# Patient Record
Sex: Male | Born: 1980 | Race: White | Hispanic: No | Marital: Married | Smoking: Current every day smoker
Health system: Southern US, Community
[De-identification: ages and names within clinical notes are randomized; demographics above are authoritative.]

---

## 2002-09-20 HISTORY — PX: WISDOM TOOTH EXTRACTION: SHX21

## 2003-09-21 HISTORY — PX: ENUCLEATION: SHX628

## 2012-09-20 HISTORY — PX: KNEE CARTILAGE SURGERY: SHX688

## 2015-05-25 ENCOUNTER — Ambulatory Visit
Admission: EM | Admit: 2015-05-25 | Discharge: 2015-05-25 | Disposition: A | Discharge status: Home / Self Care | Attending: Internal Medicine | Admitting: Internal Medicine

## 2015-05-25 DIAGNOSIS — S6010XA Contusion of unspecified finger with damage to nail, initial encounter: Secondary | ICD-10-CM | POA: Diagnosis not present

## 2015-05-25 NOTE — ED Notes (Signed)
Patient complains of left hand ring finger pain, states that he smashed yesterday. He states that his nail has blood trapped underneath and he thinks needs to be drained secondary to pressure.

## 2015-05-25 NOTE — ED Provider Notes (Signed)
CSN: [id number]     Arrival date & time 05/25/15  1325 History   First MD Initiated Contact with Patient 05/25/15 1434     Chief Complaint  Patient presents with  . 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 It did not bleed, but he has a hematoma under the nail, that has been very painful since. He would like to see if this could be drained today. Good range of motion of the finger, no swelling or bruising other than the subungual hematoma.  No past medical history on file. Past Surgical History  Procedure Laterality Date  . Enucleation Right 2005    Eye  . Wisdom tooth extraction  2004  . Knee cartilage surgery  2014    Social History  Substance Use Topics  . Smoking status: Current Every Day Smoker -- 1.00 packs/day for 15 years    Types: Cigarettes  . Smokeless tobacco: None  . Alcohol Use: 0.0 oz/week    0 Standard drinks or equivalent per week     Comment: once or twice weekly    Review of Systems  All other systems reviewed and are negative.   Allergies  Review of patient's allergies indicates no known allergies.  Home Medications   Prior to Admission medications   Not on File   Meds Ordered and Administered this Visit  Medications - No data to display  BP 128/99 mmHg  Pulse 76  Temp(Src) 97.6 F (36.4 C) (Oral)  Resp 16  Ht 6' (1.829 m)  Wt 160 lb (72.576 kg)  BMI 21.70 kg/m2  SpO2 100% No data found.   Physical Exam  Constitutional: He is oriented to person, place, and time. No distress.  Alert, nicely groomed  HENT:  Head: Atraumatic.  Eyes:  Conjugate gaze, no eye redness/drainage  Neck: Neck supple.  Cardiovascular: Normal rate.   Pulmonary/Chest: No respiratory distress.  Abdominal: Soft. He exhibits no distension.  Musculoskeletal: Normal range of motion.  Left fourth fingernail has hematoma evident, no bleeding or crusting around the  nail. The distal finger itself is not swollen, not bruised, not red. Excellent range of motion at the DIP and PIP. Skin is intact.  Neurological: He is alert and oriented to person, place, and time.  Skin: Skin is warm and dry.  No cyanosis  Nursing note and vitals reviewed.   ED Course  Procedures  Trephination attempt was made with a #11 blade, the patient was unable to tolerate the pressure needed to achieve a hole in the fingernail. Digital block was performed of the left fourth digit with 1% lidocaine, 4.5 cc, after the base of the finger was prepped with Hibiclens. 2 tiny openings were made in the fingernail, with release of a large amount of subungual blood. The nail was subsequently washed with Hibiclens, and some antibiotic ointment was applied.  MDM   1. Subungual hematoma of finger of left hand, initial encounter    Keep the nail clean and dry, recheck for any increasing redness/swelling/pain/drainage from the nail.    Eustace Moore, MD 05/25/15 [phone]

## 2015-05-25 NOTE — Discharge Instructions (Signed)
Subungual Hematoma °A subungual hematoma is a pocket of blood that collects under the fingernail or toenail. The pressure created by the blood under the nail can cause pain. °CAUSES  °A subungual hematoma occurs when an injury to the finger or toe causes a blood vessel beneath the nail to break. The injury can occur from a direct blow such as slamming a finger in a door. It can also occur from a repeated injury such as pressure on the foot in a shoe while running. A subungual hematoma is sometimes called runner's toe or tennis toe. °SYMPTOMS  °· Blue or dark blue skin under the nail. °· Pain or throbbing in the injured area. °DIAGNOSIS  °Your caregiver can determine whether you have a subungual hematoma based on your history and a physical exam. If your caregiver thinks you might have a broken (fractured) bone, X-rays may be taken. °TREATMENT  °Hematomas usually go away on their own over time. Your caregiver may make a hole in the nail to drain the blood. Draining the blood is painless and usually provides significant relief from pain and throbbing. The nail usually grows back normally after this procedure. In some cases, the nail may need to be removed. This is done if there is a cut under the nail that requires stitches (sutures). °HOME CARE INSTRUCTIONS  °· Put ice on the injured area. °¨ Put ice in a plastic bag. °¨ Place a towel between your skin and the bag. °¨ Leave the ice on for 15-20 minutes, 03-04 times a day for the first 1 to 2 days. °· Elevate the injured area to help decrease pain and swelling. °· If you were given a bandage, wear it for as long as directed by your caregiver. °· If part of your nail falls off, trim the remaining nail gently. This prevents the nail from catching on something and causing further injury. °· Only take over-the-counter or prescription medicines for pain, discomfort, or fever as directed by your caregiver. °SEEK IMMEDIATE MEDICAL CARE IF:  °· You have redness or swelling  around the nail. °· You have yellowish-white fluid (pus) coming from the nail. °· Your pain is not controlled with medicine. °· You have a fever. °MAKE SURE YOU: °· Understand these instructions. °· Will watch your condition. °· Will get help right away if you are not doing well or get worse. °Document Released: 09/03/2000 Document Revised: 11/29/2011 Document Reviewed: 08/25/2011 °Exit[employer]® Patient Information ©2015 [employer]. This information is not intended to replace advice given to you by your health care provider. Make sure you discuss any questions you have with your health care provider. ° °

## 2016-07-13 ENCOUNTER — Ambulatory Visit
Admission: EM | Admit: 2016-07-13 | Discharge: 2016-07-13 | Disposition: A | Discharge status: Home / Self Care | Attending: Internal Medicine | Admitting: Internal Medicine

## 2016-07-13 ENCOUNTER — Encounter: Payer: Self-pay | Admitting: Emergency Medicine

## 2016-07-13 DIAGNOSIS — J208 Acute bronchitis due to other specified organisms: Secondary | ICD-10-CM | POA: Diagnosis not present

## 2016-07-13 DIAGNOSIS — H6693 Otitis media, unspecified, bilateral: Secondary | ICD-10-CM

## 2016-07-13 DIAGNOSIS — J209 Acute bronchitis, unspecified: Secondary | ICD-10-CM

## 2016-07-13 MED ORDER — PREDNISONE 50 MG PO TABS: 50.0000 mg | ORAL_TABLET | Freq: Every day | ORAL | 0 refills | Status: DC

## 2016-07-13 MED ORDER — AMOXICILLIN-POT CLAVULANATE 875-125 MG PO TABS: 1.0000 | ORAL_TABLET | Freq: Two times a day (BID) | ORAL | 0 refills | Status: AC

## 2016-07-13 MED ORDER — ALBUTEROL SULFATE HFA 108 (90 BASE) MCG/ACT IN AERS: 2.0000 | INHALATION_SPRAY | RESPIRATORY_TRACT | 0 refills | Status: DC | PRN

## 2016-07-13 NOTE — ED Triage Notes (Signed)
Patient c/o cough, sinus pressure and congestion that started last Friday.

## 2016-07-13 NOTE — ED Provider Notes (Signed)
[facility]    CSN: [id number] Arrival date & time: 07/13/16  [id number]     History   Chief Complaint Chief Complaint  Patient presents with  . Cough  . Facial Pain    HPI Jon AmesDonald Rowland is a 35 y.o. male. He presents today with 3 day history of severe sinus congestion, pounding headache, runny nose. Productive cough. Discomfort and congestion in the ears especially on the right. Scratchy sore throat. Didn't sleep last night. Tactile temp, but no chills. Achy all over. No nausea/vomiting, no diarrhea. 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 Needs a note for work today.  HPI  History reviewed. No pertinent past medical history.  Past Surgical History:  Procedure Laterality Date  . ENUCLEATION Right 2005   Eye  . KNEE CARTILAGE SURGERY  2014  . WISDOM TOOTH EXTRACTION  2004       Home Medications    Prior to Admission medications   Medication Sig Start Date End Date Taking? Authorizing Provider  albuterol (PROVENTIL HFA;VENTOLIN HFA) 108 (90 Base) MCG/ACT inhaler Inhale 2 puffs into the lungs every 4 (four) hours as needed for wheezing or shortness of breath. 07/13/16   Jon MooreLaura W Ichael Pullara, MD  amoxicillin-clavulanate (AUGMENTIN) 875-125 MG tablet Take 1 tablet by mouth 2 (two) times daily. 07/13/16 07/23/16  Jon MooreLaura W Jon Panjwani, MD  predniSONE (DELTASONE) 50 MG tablet Take 1 tablet (50 mg total) by mouth daily. 07/13/16   Jon MooreLaura W Jon Thurmon, MD    Family History History reviewed. No pertinent family history.  Social History Social History  Substance Use Topics  . Smoking status: Current Every Day Smoker    Packs/day: 1.00    Years: 15.00    Types: Cigarettes  . Smokeless tobacco: Never Used  . Alcohol use 0.0 oz/week     Comment: once or twice weekly     Allergies   Review of patient's allergies indicates no known allergies.   Review of Systems Review of Systems  All other systems reviewed and are negative.    Physical  Exam Triage Vital Signs ED Triage Vitals  Enc Vitals Group     BP 07/13/16 1202 (!) 136/98     Pulse Rate 07/13/16 1202 88     Resp 07/13/16 1202 16     Temp 07/13/16 1202 97.5 F (36.4 C)     Temp Source 07/13/16 1202 Oral     SpO2 07/13/16 1202 95 %     Weight 07/13/16 1201 165 lb (74.8 kg)     Height 07/13/16 1201 6' (1.829 m)     Pain Score 07/13/16 1202 0  d Vital Signs BP (!) 136/98 (BP Location: Left Arm)   Pulse 88   Temp 97.5 F (36.4 C) (Oral)   Resp 16   Ht 6' (1.829 m)   Wt 165 lb (74.8 kg)   SpO2 95%   BMI 22.38 kg/m  Physical Exam  Constitutional: He is oriented to person, place, and time. No distress.  Alert, nicely groomed Voice sounds quite congested  HENT:  Head: Atraumatic.  Bilateral TMs are quite dull, red Severe congestion bilaterally, with mucopurulent material present on the left Throat is very red, with modestly enlarged tonsils, no exudate. 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 Left eye without redness/drainage  Neck: Neck supple.  Cardiovascular: Normal rate and regular rhythm.   Pulmonary/Chest: No respiratory distress.  Coarse but symmetric breath  sounds with bibasilar wheezing posteriorly  Abdominal: He exhibits no distension.  Musculoskeletal: Normal range of motion.  Neurological: He is alert and oriented to person, place, and time.  Skin: Skin is warm and dry.  No cyanosis  Nursing note and vitals reviewed.    UC Treatments / Results   Procedures Procedures (including critical care time)      None today  Final Clinical Impressions(s) / UC Diagnoses   Final diagnoses:  Acute bronchitis due to infection  Acute otitis media, bilateral   Prescriptions were sent to the [facility] in [place].  Recheck for increasing phlegm production, new fever >100.5, or if not starting to improve in the next few days.  Anticipate gradual improvement in cough/congestion  over the next 1-2 weeks.    New Prescriptions Discharge Medication List as of 07/13/2016 12:25 PM    START taking these medications   Details  albuterol (PROVENTIL HFA;VENTOLIN HFA) 108 (90 Base) MCG/ACT inhaler Inhale 2 puffs into the lungs every 4 (four) hours as needed for wheezing or shortness of breath., Starting Tue 07/13/2016, Normal    amoxicillin-clavulanate (AUGMENTIN) 875-125 MG tablet Take 1 tablet by mouth 2 (two) times daily., Starting Tue 07/13/2016, Until Fri 07/23/2016, Normal    predniSONE (DELTASONE) 50 MG tablet Take 1 tablet (50 mg total) by mouth daily., Starting Tue 07/13/2016, Normal         Jon Moore, MD 07/14/16 [phone]

## 2016-07-13 NOTE — Discharge Instructions (Addendum)
Prescriptions were sent to the [facility] in [place].  Recheck for increasing phlegm production, new fever >100.5, or if not starting to improve in the next few days.  Anticipate gradual improvement in cough/congestion over the next 1-2 weeks.

## 2016-07-15 ENCOUNTER — Telehealth: Payer: Self-pay | Admitting: Emergency Medicine

## 2018-05-31 ENCOUNTER — Other Ambulatory Visit: Payer: Self-pay

## 2018-05-31 ENCOUNTER — Encounter: Payer: Self-pay | Admitting: Emergency Medicine

## 2018-05-31 ENCOUNTER — Ambulatory Visit
Admission: EM | Admit: 2018-05-31 | Discharge: 2018-05-31 | Disposition: A | Discharge status: Home / Self Care | Attending: Family Medicine | Admitting: Family Medicine

## 2018-05-31 DIAGNOSIS — Z23 Encounter for immunization: Secondary | ICD-10-CM

## 2018-05-31 DIAGNOSIS — S61011A Laceration without foreign body of right thumb without damage to nail, initial encounter: Secondary | ICD-10-CM | POA: Diagnosis not present

## 2018-05-31 DIAGNOSIS — W268XXA Contact with other sharp object(s), not elsewhere classified, initial encounter: Secondary | ICD-10-CM

## 2018-05-31 MED ORDER — TETANUS-DIPHTH-ACELL PERTUSSIS 5-2.5-18.5 LF-MCG/0.5 IM SUSP
0.5000 mL | Freq: Once | INTRAMUSCULAR | Status: AC
  Administered 2018-05-31: 0.5 mL via INTRAMUSCULAR

## 2018-05-31 MED ORDER — LIDOCAINE HCL (PF) 1 % IJ SOLN: 5.0000 mL | Freq: Once | INTRAMUSCULAR | Status: DC

## 2018-05-31 NOTE — Discharge Instructions (Addendum)
Keep clean and dry.  Topical over-the-counter antibiotic ointment daily.  Clean with soap and water.  Use finger splints for at least 2 days.  Return to urgent care in 7 to 10 days for suture removal.  Follow up with your primary care physician this week as needed. Return to Urgent care for new or worsening concerns.

## 2018-05-31 NOTE — ED Triage Notes (Signed)
Patient states that went to through away a coffee mug it shattered and cut his right thumb.

## 2018-05-31 NOTE — ED Provider Notes (Signed)
[facility] ____________________________________________  Time seen: Approximately 9:40 AM  I have reviewed the triage vital signs and the nursing notes.   HISTORY  Chief Complaint Extremity Laceration (right thumb)  HPI Gregeory Diangelo is a 37 y.o. male presenting with wife at bedside for evaluation of right thumb laceration that occurred at approximately 6 this morning.  Patient reports the coffee pot accidentally broke at the end and he accidentally cut his right thumb.  Denies any other injury trauma or crush injury.  Last tetanus immunization 9 years ago.  States mild pain to thumb.  Denies any other pain.  No alleviating measures attempted.  Denies aggravating factors.  Denies paresthesias, decreased range of motion, pain radiation or other complaints.  Reports otherwise feels well. Denies recent sickness. Denies recent antibiotic use.    past medical history Right facial trauma  There are no active problems to display for this patient.   Past Surgical History:  Procedure Laterality Date  . ENUCLEATION Right 2005   Eye  . KNEE CARTILAGE SURGERY  2014  . WISDOM TOOTH EXTRACTION  2004      Current Facility-Administered Medications:  .  lidocaine (PF) (XYLOCAINE) 1 % injection 5 mL, 5 mL, Other, Once, Renford Dills, NP No current outpatient medications on file.  Allergies Patient has no known allergies.  History reviewed. No pertinent family history.  Social History Social History   Tobacco Use  . Smoking status: Current Every Day Smoker    Packs/day: 1.00    Years: 15.00    Pack years: 15.00    Types: Cigarettes  . Smokeless tobacco: Never Used  Substance Use Topics  . Alcohol use: Yes    Alcohol/week: 0.0 standard drinks    Comment: once or twice weekly  . Drug use: No    Review of Systems Constitutional: No fever/chills Cardiovascular: Denies chest pain. Respiratory: Denies shortness of breath. Gastrointestinal: No abdominal pain.     Musculoskeletal: Negative for back pain. Skin:as above.   ____________________________________________   PHYSICAL EXAM:  VITAL SIGNS: ED Triage Vitals  Enc Vitals Group     BP 05/31/18 0838 (!) 138/112     Pulse Rate 05/31/18 0838 100     Resp 05/31/18 0838 16     Temp 05/31/18 0838 97.9 F (36.6 C)     Temp Source 05/31/18 0838 Oral     SpO2 05/31/18 0838 95 %     Weight 05/31/18 0835 165 lb (74.8 kg)     Height 05/31/18 0835 6' (1.829 m)     Head Circumference --      Peak Flow --      Pain Score 05/31/18 0835 7     Pain Loc --      Pain Edu? --      Excl. in GC? --    Vitals:   05/31/18 0835 05/31/18 0838 05/31/18 0957  BP:  (!) 138/112 (!) 120/92  Pulse:  100   Resp:  16   Temp:  97.9 F (36.6 C)   TempSrc:  Oral   SpO2:  95%   Weight: 165 lb (74.8 kg)    Height: 6' (1.829 m)       Constitutional: Alert and oriented. Well appearing and in no acute distress. ENT      Head: Normocephalic and atraumatic. Cardiovascular: Normal rate, regular rhythm. Grossly normal heart sounds.  Good peripheral circulation. Respiratory: Normal respiratory effort without tachypnea nor retractions. Breath sounds are clear and equal bilaterally. No wheezes, rales,  rhonchi. Musculoskeletal: Steady gait.  Neurologic:  Normal speech and language.  Speech is normal. No gait instability.  Skin:  Skin is warm, dry.  Except: Right medial to mid dorsal thumb 3 cm laceration present with active bleeding, no foreign body visualized, no bone or tendon visualized, no tendon injury noted, full range of motion present, good resistance and extension no motor or tendon deficit noted, normal distal sensation, no point bony tenderness, gaping wound. Psychiatric: Mood and affect are normal. Speech and behavior are normal. Patient exhibits appropriate insight and judgment   ___________________________________________   LABS (all labs ordered are listed, but only abnormal results are  displayed)  Labs Reviewed - No data to display   PROCEDURES Procedures   Procedure(s) performed:  Procedure explained and verbal consent obtained. Consent: Verbal consent obtained. Written consent not obtained. Risks and benefits: risks, benefits and alternatives were discussed Patient identity confirmed: verbally with patient and hospital-assigned identification number  Consent given by: patient   Laceration Repair Location: Right thumb Length: 3 cm Foreign bodies: no foreign bodies Tendon involvement: none Nerve involvement: none Preparation: Patient was prepped and draped in the usual sterile fashion. Anesthesia with 1% Lidocaine 5 Mls Cleaned with Betadine Irrigation solution: saline Wound margins revised Irrigation method: jet lavage Amount of cleaning: copious Repaired with 5-0 nylon Number of sutures: 6 Technique: simple interrupted  Approximation: loose Patient tolerate well. Wound well approximated post repair.  Antibiotic ointment and dressing applied.  Wound care instructions provided.  Observe for any signs of infection or other problems.      INITIAL IMPRESSION / ASSESSMENT AND PLAN / ED COURSE  Pertinent labs & imaging results that were available during my care of the patient were reviewed by me and considered in my medical decision making (see chart for details).  Well-appearing patient.  No acute distress.  Right thumb laceration cleaned and repaired as above.  Patient tolerated well.  Tetanus immunization updated.  Directed for supportive care, wound care and suture removal in 7 to 10 days.   Discussed follow up and return parameters including no resolution or any worsening concerns. Patient verbalized understanding and agreed to plan.   ____________________________________________   FINAL CLINICAL IMPRESSION(S) / ED DIAGNOSES  Final diagnoses:  Laceration of right thumb without foreign body without damage to nail, initial encounter     ED  Discharge Orders    None       Note: This dictation was prepared with Dragon dictation along with smaller phrase technology. Any transcriptional errors that result from this process are unintentional.         Renford Dills, NP 05/31/18 1010

## 2018-07-11 ENCOUNTER — Other Ambulatory Visit: Payer: Self-pay

## 2018-07-11 ENCOUNTER — Ambulatory Visit
Admission: EM | Admit: 2018-07-11 | Discharge: 2018-07-11 | Disposition: A | Discharge status: Home / Self Care | Attending: Family Medicine | Admitting: Family Medicine

## 2018-07-11 DIAGNOSIS — M62838 Other muscle spasm: Secondary | ICD-10-CM | POA: Diagnosis not present

## 2018-07-11 MED ORDER — METAXALONE 800 MG PO TABS: 800.0000 mg | ORAL_TABLET | Freq: Three times a day (TID) | ORAL | 0 refills | Status: DC | PRN

## 2018-07-11 MED ORDER — MELOXICAM 15 MG PO TABS: 15.0000 mg | ORAL_TABLET | Freq: Every day | ORAL | 0 refills | Status: DC | PRN

## 2018-07-11 NOTE — Discharge Instructions (Signed)
Rest, heat.  Meds as needed.  Take care  Dr. Adriana Simas

## 2018-07-11 NOTE — ED Triage Notes (Signed)
Patient complains of right side of neck pain that started 2 weeks ago after sleeping wrong.

## 2018-07-11 NOTE — ED Provider Notes (Signed)
[facility]    CSN: [id number] Arrival date & time: 07/11/18  1118  History   Chief Complaint Chief Complaint  Patient presents with  . Neck Pain   HPI  37 year old male presents with neck pain.  2-week history of right-sided neck pain.  Associated right-sided upper lumbar pain as well.  Worse with range of motion.  He states that it started after he slept wrong.  He has tried sleeping in different positions as well as different places in the household without improvement.  No reports of radicular symptoms.  No known relieving factors.  No recent fall, trauma, injury.  His pain is currently moderate in severity.  No other associated symptoms.  No other complaints.  Hx reviewed as below.  Past Surgical History:  Procedure Laterality Date  . ENUCLEATION Right 2005   Eye  . KNEE CARTILAGE SURGERY  2014  . WISDOM TOOTH EXTRACTION  2004   Home Medications    Prior to Admission medications   Medication Sig Start Date End Date Taking? Authorizing Provider  meloxicam (MOBIC) 15 MG tablet Take 1 tablet (15 mg total) by mouth daily as needed for pain. 07/11/18   Tommie Sams, DO  metaxalone (SKELAXIN) 800 MG tablet Take 1 tablet (800 mg total) by mouth 3 (three) times daily as needed for muscle spasms. 07/11/18   Tommie Sams, DO   Social History Social History   Tobacco Use  . Smoking status: Current Every Day Smoker    Packs/day: 1.00    Years: 15.00    Pack years: 15.00    Types: Cigarettes  . Smokeless tobacco: Never Used  Substance Use Topics  . Alcohol use: Yes    Alcohol/week: 0.0 standard drinks    Comment: once or twice weekly  . Drug use: No     Allergies   Patient has no known allergies.   Review of Systems Review of Systems  Constitutional: Negative.   Musculoskeletal: Positive for back pain and neck pain.  Neurological: Negative.    Physical Exam Triage Vital Signs ED Triage Vitals  Enc Vitals Group     BP 07/11/18 1130 (!) 142/106       Pulse Rate 07/11/18 1130 (!) 106     Resp 07/11/18 1130 18     Temp 07/11/18 1130 98.2 F (36.8 C)     Temp Source 07/11/18 1130 Oral     SpO2 07/11/18 1130 98 %     Weight 07/11/18 1128 165 lb (74.8 kg)     Height 07/11/18 1128 6' (1.829 m)     Head Circumference --      Peak Flow --      Pain Score 07/11/18 1128 7     Pain Loc --      Pain Edu? --      Excl. in GC? --    Updated Vital Signs BP (!) 142/106 (BP Location: Left Arm)   Pulse (!) 106   Temp 98.2 F (36.8 C) (Oral)   Resp 18   Ht 6' (1.829 m)   Wt 74.8 kg   SpO2 98%   BMI 22.38 kg/m   Visual Acuity Right Eye Distance:   Left Eye Distance:   Bilateral Distance:    Right Eye Near:   Left Eye Near:    Bilateral Near:     Physical Exam  Constitutional: He is oriented to person, place, and time. He appears well-developed. No distress.  HENT:  Head: Normocephalic and atraumatic.  Cardiovascular: Regular rhythm.  Tachycardia.  Pulmonary/Chest: Effort normal and breath sounds normal. He has no wheezes. He has no rales.  Musculoskeletal:  Patient with right trapezius exquisite tenderness to palpation.  Neurological: He is alert and oriented to person, place, and time.  Psychiatric: He has a normal mood and affect. His behavior is normal.  Nursing note and vitals reviewed.  UC Treatments / Results  Labs (all labs ordered are listed, but only abnormal results are displayed) Labs Reviewed - No data to display  EKG None  Radiology No results found.  Procedures Procedures (including critical care time)  Medications Ordered in UC Medications - No data to display  Initial Impression / Assessment and Plan / UC Course  I have reviewed the triage vital signs and the nursing notes.  Pertinent labs & imaging results that were available during my care of the patient were reviewed by me and considered in my medical decision making (see chart for details).     37 year old male presents with trapezius  muscle spasm.  Treating with meloxicam and Skelaxin.  Work note given.  Final Clinical Impressions(s) / UC Diagnoses   Final diagnoses:  Trapezius muscle spasm     Discharge Instructions     Rest, heat.  Meds as needed.  Take care  Dr. Adriana Simas    ED Prescriptions    Medication Sig Dispense Auth. Provider   meloxicam (MOBIC) 15 MG tablet Take 1 tablet (15 mg total) by mouth daily as needed for pain. 30 tablet Yesena Reaves G, DO   metaxalone (SKELAXIN) 800 MG tablet Take 1 tablet (800 mg total) by mouth 3 (three) times daily as needed for muscle spasms. 30 tablet Tommie Sams, DO     Controlled Substance Prescriptions  Controlled Substance Registry consulted? Not Applicable   Tommie Sams, DO 07/11/18 1211

## 2018-11-27 ENCOUNTER — Encounter: Payer: Self-pay | Admitting: Emergency Medicine

## 2018-11-27 ENCOUNTER — Ambulatory Visit
Admission: EM | Admit: 2018-11-27 | Discharge: 2018-11-27 | Disposition: A | Discharge status: Home / Self Care | Attending: Family Medicine | Admitting: Family Medicine

## 2018-11-27 ENCOUNTER — Other Ambulatory Visit: Payer: Self-pay

## 2018-11-27 DIAGNOSIS — M25571 Pain in right ankle and joints of right foot: Secondary | ICD-10-CM

## 2018-11-27 MED ORDER — MELOXICAM 15 MG PO TABS: 15.0000 mg | ORAL_TABLET | Freq: Every day | ORAL | 0 refills | Status: DC | PRN

## 2018-11-27 NOTE — ED Triage Notes (Signed)
Patient stated he is having right ankle pain that started on Saturday. He states he had hockey practice on Saturday and doesn't recall any injury.

## 2018-11-27 NOTE — ED Provider Notes (Signed)
[facility] ____________________________________________  Time seen: Approximately 10:30 AM  I have reviewed the triage vital signs and the nursing notes.   HISTORY  Chief Complaint Ankle Pain   HPI Jon Rowland is a 38 y.o. male presenting for evaluation of right ankle pain since Saturday night.  States Saturday during the day he did play hockey, and states he may have been injured but does not remember any specific injury.  States he felt fine immediately after hockey, but reports once he got home and rested his ankle he then felt the pain when he got up.  States pain is worse with direct palpation as well as walking.  Has continued to ambulate.  Denies other alleviating measures.  States able to still move ankle overall well, but states he has somewhat guarded movement.  Multiple sprains in the past, but denies any worse injuries.  Reports otherwise feels well and denies other complaints.    History reviewed. No pertinent past medical history.  There are no active problems to display for this patient.   Past Surgical History:  Procedure Laterality Date  . ENUCLEATION Right 2005   Eye  . KNEE CARTILAGE SURGERY  2014  . WISDOM TOOTH EXTRACTION  2004     No current facility-administered medications for this encounter.   Current Outpatient Medications:  .  meloxicam (MOBIC) 15 MG tablet, Take 1 tablet (15 mg total) by mouth daily as needed., Disp: 10 tablet, Rfl: 0  Allergies Patient has no known allergies.  History reviewed. No pertinent family history.  Social History Social History   Tobacco Use  . Smoking status: Current Every Day Smoker    Packs/day: 1.00    Years: 15.00    Pack years: 15.00    Types: Cigarettes  . Smokeless tobacco: Never Used  Substance Use Topics  . Alcohol use: Yes    Alcohol/week: 0.0 standard drinks    Comment: once or twice weekly  . Drug use: No    Review of Systems Constitutional: No fever Cardiovascular: Denies  chest pain. Respiratory: Denies shortness of breath. Gastrointestinal: No abdominal pain. Musculoskeletal: Positive right ankle pain. Skin: Negative for rash.   ____________________________________________   PHYSICAL EXAM:  VITAL SIGNS: ED Triage Vitals  Enc Vitals Group     BP 11/27/18 0909 (!) 136/105     Pulse Rate 11/27/18 0909 78     Resp 11/27/18 0909 18     Temp 11/27/18 0909 97.9 F (36.6 C)     Temp Source 11/27/18 0909 Oral     SpO2 11/27/18 0909 100 %     Weight 11/27/18 0907 165 lb (74.8 kg)     Height 11/27/18 0907 6' (1.829 m)     Head Circumference --      Peak Flow --      Pain Score 11/27/18 0907 7     Pain Loc --      Pain Edu? --      Excl. in GC? --     Constitutional: Alert and oriented. Well appearing and in no acute distress. ENT      Head: Normocephalic and atraumatic. Cardiovascular: Normal rate, regular rhythm. Grossly normal heart sounds.  Good peripheral circulation. Respiratory: Normal respiratory effort without tachypnea nor retractions. Breath sounds are clear and equal bilaterally. No wheezes, rales, rhonchi. Musculoskeletal:Bilateral pedal pulses 2+ equal and easily palpated.  Except: Right lateral ankle mild to moderate tenderness to palpation along ATFL ligament, minimal tenderness to lower lateral malleolus, and tenderness at  lateral collateral ligament, no posterior tenderness, no tenderness Achilles tendon, negative Thompson squeeze test, pain with ankle rotation as well as full plantarflexion and dorsiflexion, right lower extremity otherwise nontender.  Ambulatory with mild antalgic gait. Neurologic:  Normal speech and language.Speech is normal. No gait instability.  Skin:  Skin is warm, dry and intact. No rash noted. Psychiatric: Mood and affect are normal. Speech and behavior are normal. Patient exhibits appropriate insight and judgment   ___________________________________________   LABS (all labs ordered are listed, but only  abnormal results are displayed)  Labs Reviewed - No data to display  PROCEDURES Procedures    INITIAL IMPRESSION / ASSESSMENT AND PLAN / ED COURSE  Pertinent labs & imaging results that were available during my care of the patient were reviewed by me and considered in my medical decision making (see chart for details).  Well-appearing patient.  No acute distress.  Right ankle pain for the last 2 days.  Did play hockey, but denies any known direct injury.  suspect sprain injury.  Minimal point bony tenderness.  Discussed supportive care versus x-ray at this time, will defer x-ray, patient agrees.  Ankle splint given.  Patient denies crutches.  Mobic Rx given.  Ice, rest, supportive care and gradual increase of activity as tolerated.  Discussed follow-up for imaging for continued pain.Discussed indication, risks and benefits of medications with patient.  Discussed follow up with Primary care physician this week. Discussed follow up and return parameters including no resolution or any worsening concerns. Patient verbalized understanding and agreed to plan.   ____________________________________________   FINAL CLINICAL IMPRESSION(S) / ED DIAGNOSES  Final diagnoses:  Acute right ankle pain     ED Discharge Orders         Ordered    meloxicam (MOBIC) 15 MG tablet  Daily PRN     11/27/18 0936           Note: This dictation was prepared with Dragon dictation along with smaller phrase technology. Any transcriptional errors that result from this process are unintentional.         Renford Dills, NP 11/27/18 1036

## 2018-11-27 NOTE — Discharge Instructions (Addendum)
Take medication as prescribed. Rest. Ice. Elevate. Monitor. Gradual increase activity as tolerated.   Follow up with your primary care physician this week as needed. Return to Urgent care for new or worsening concerns.

## 2019-06-01 ENCOUNTER — Encounter: Payer: Self-pay | Admitting: Emergency Medicine

## 2019-06-01 ENCOUNTER — Ambulatory Visit (INDEPENDENT_AMBULATORY_CARE_PROVIDER_SITE_OTHER)

## 2019-06-01 ENCOUNTER — Other Ambulatory Visit: Payer: Self-pay

## 2019-06-01 ENCOUNTER — Ambulatory Visit
Admission: EM | Admit: 2019-06-01 | Discharge: 2019-06-01 | Disposition: A | Discharge status: Home / Self Care | Attending: Emergency Medicine | Admitting: Emergency Medicine

## 2019-06-01 DIAGNOSIS — X500XXA Overexertion from strenuous movement or load, initial encounter: Secondary | ICD-10-CM | POA: Diagnosis not present

## 2019-06-01 DIAGNOSIS — T148XXA Other injury of unspecified body region, initial encounter: Secondary | ICD-10-CM

## 2019-06-01 DIAGNOSIS — M25512 Pain in left shoulder: Secondary | ICD-10-CM | POA: Diagnosis not present

## 2019-06-01 DIAGNOSIS — S40019A Contusion of unspecified shoulder, initial encounter: Secondary | ICD-10-CM | POA: Diagnosis not present

## 2019-06-01 MED ORDER — IBUPROFEN 600 MG PO TABS: 600.0000 mg | ORAL_TABLET | Freq: Four times a day (QID) | ORAL | 0 refills | Status: DC | PRN

## 2019-06-01 MED ORDER — HYDROCODONE-ACETAMINOPHEN 5-325 MG PO TABS: 1.0000 | ORAL_TABLET | Freq: Four times a day (QID) | ORAL | 0 refills | Status: DC | PRN

## 2019-06-01 MED ORDER — TIZANIDINE HCL 4 MG PO TABS: 4.0000 mg | ORAL_TABLET | Freq: Three times a day (TID) | ORAL | 0 refills | Status: DC | PRN

## 2019-06-01 NOTE — ED Provider Notes (Signed)
HPI  SUBJECTIVE:  Jon Rowland is a 38 y.o. male who presents with constant sharp pain, swelling at the sternoclavicular joint/medial clavicle starting yesterday after lifting heavy cast iron pipes and digging a deep hole.  States that he has shoulder tightness, but no shoulder pain.  States that he was hit by a hockey puck in this area 4 days ago, which left a "mark".  He was not wearing protective equipment in this area.  States that the pain is worse today than immediately after the original injury.  No shoulder weakness, distal numbness or tingling.  No chest pain, shortness of breath, cough, hemoptysis.  He tried Tylenol, ice without improvement in his symptoms.  Symptoms are worse with palpation, wearing a seatbelt, rolling over in bed, shoulder range of motion.  Past medical history negative for osteoporosis, clavicle fracture, coagulopathy.  PMD: [facility].  History reviewed. No pertinent past medical history.  Past Surgical History:  Procedure Laterality Date  . ENUCLEATION Right 2005   Eye  . KNEE CARTILAGE SURGERY  2014  . WISDOM TOOTH EXTRACTION  2004    Family History  Problem Relation Age of Onset  . Healthy Mother   . Other Father        unknown medical history    Social History   Tobacco Use  . Smoking status: Current Every Day Smoker    Packs/day: 1.00    Years: 15.00    Pack years: 15.00    Types: Cigarettes  . Smokeless tobacco: Never Used  Substance Use Topics  . Alcohol use: Yes    Alcohol/week: 0.0 standard drinks    Comment: once or twice weekly  . Drug use: No    No current facility-administered medications for this encounter.   Current Outpatient Medications:  .  HYDROcodone-acetaminophen (NORCO/VICODIN) 5-325 MG tablet, Take 1-2 tablets by mouth every 6 (six) hours as needed for moderate pain or severe pain., Disp: 12 tablet, Rfl: 0 .  ibuprofen (ADVIL) 600 MG tablet, Take 1 tablet (600 mg total) by mouth every 6 (six) hours as needed.,  Disp: 30 tablet, Rfl: 0 .  tiZANidine (ZANAFLEX) 4 MG tablet, Take 1 tablet (4 mg total) by mouth every 8 (eight) hours as needed for muscle spasms., Disp: 30 tablet, Rfl: 0  No Known Allergies   ROS  As noted in HPI.   Physical Exam  BP (!) 129/104 (BP Location: Right Arm)   Pulse 88   Temp 99.1 F (37.3 C) (Oral)   Resp 18   Ht 6' (1.829 m)   Wt 77.1 kg   SpO2 99%   BMI 23.06 kg/m   Constitutional: Well developed, well nourished, no acute distress Eyes:  EOMI, conjunctiva normal bilaterally HENT: Normocephalic, atraumatic,mucus membranes moist Respiratory: Normal inspiratory effort, good air movement throughout all lung fields left side. Cardiovascular: Normal rate regular rhythm no murmurs rubs or gallops.  Positive erythematous abrasion on anterior chest at the junction of the medial clavicle and sternum.  Positive tender swelling medial clavicle.  No crepitus, tenderness over the sternum.      GI: nondistended skin: No rash, skin intact Musculoskeletal: R shoulder with ROM  somewhat limited due to pain at the medial clavicle.  He denies shoulder pain during range of motion, Drop test  painful but negative, middle and distal clavicle NT, A/C joint NT, scapula NT , proximal humerus NT , shoulder joint NT, Motor strength normal, Sensation intact LT over deltoid region, distal NVI with hand on affected  side having intact sensation and strength in the distribution of the median, radial, and ulnar nerve.  Pain at clavicle aggravated with internal rotation, no pain with external rotation, pain at clavicle aggravated with empty can test, liftoff test.   Neurologic: Alert & oriented x 3, no focal neuro deficits Psychiatric: Speech and behavior appropriate   ED Course   Medications - No data to display  Orders Placed This Encounter  Procedures  . DG Clavicle Left    Standing Status:   Standing    Number of Occurrences:   1    Order Specific Question:   Reason for Exam  (SYMPTOM  OR DIAGNOSIS REQUIRED)    Answer:   trauma swelling tenderenss medial clavicle r/o fx dislocation    No results found for this or any previous visit (from the past 55 hour(s)). Dg Clavicle Left  Result Date: 06/01/2019 CLINICAL DATA:  Shoulder pain and erythema following injury several days ago. EXAM: LEFT CLAVICLE - 2+ VIEWS COMPARISON:  None. FINDINGS: The mineralization and alignment are normal. There is no evidence of acute fracture or dislocation. The acromioclavicular and glenohumeral joints appear unremarkable. The subacromial space is preserved. The sternoclavicular joint is not well visualized. No evidence of foreign body or focal soft tissue swelling. IMPRESSION: Normal left clavicle radiographs.  No evidence of acute injury. Electronically Signed   By: Richardean Sale M.D.   On: 06/01/2019 11:26    ED Clinical Impression  1. Contusion of clavicular region      ED Assessment/Plan  Will x-ray clavicle to rule out clavicle fracture, clavicle dislocation. If negative, suspect contusion at the sternoclavicular joint.  Home with ibuprofen 600 mg combined with a Tylenol containing product 3 or 4 times a day.  1 g of Tylenol for mild to moderate pain, 1-2 Norco for severe pain.  Also Zanaflex because patient states that the tightness extending over his pectoral area feels like a muscle spasm.  [place] [facility] database reviewed for this patient, and feel that the risk/benefit ratio today is favorable for proceeding with a prescription for controlled substance.  No opiate prescriptions in 2 years.  Reviewed radiology independently.  Normal.  See radiology report for full details.  Plan as above.  If does not get better in a week, follow-up with [facility] for further evaluation to make sure that he does not have a sternoclavicular dislocation.  Discussed imaging, MDM, treatment plan, and plan for follow-up with patient. Discussed sn/sx that should prompt return to the ED. patient  agrees with plan.   Meds ordered this encounter  Medications  . HYDROcodone-acetaminophen (NORCO/VICODIN) 5-325 MG tablet    Sig: Take 1-2 tablets by mouth every 6 (six) hours as needed for moderate pain or severe pain.    Dispense:  12 tablet    Refill:  0  . ibuprofen (ADVIL) 600 MG tablet    Sig: Take 1 tablet (600 mg total) by mouth every 6 (six) hours as needed.    Dispense:  30 tablet    Refill:  0  . tiZANidine (ZANAFLEX) 4 MG tablet    Sig: Take 1 tablet (4 mg total) by mouth every 8 (eight) hours as needed for muscle spasms.    Dispense:  30 tablet    Refill:  0    *This clinic note was created using [employer]. Therefore, there may be occasional mistakes despite careful proofreading.   ?    Melynda Ripple, MD 06/01/19 1153

## 2019-06-01 NOTE — Discharge Instructions (Addendum)
Your x-ray was negative for clavicle fracture or dislocation.  Ice the affected area for 20 minutes at a time.  Take ibuprofen combined with a Tylenol containing product 3 or 4 times a day as needed for pain.  Ibuprofen combined with 1 g of Tylenol for mild to moderate pain, ibuprofen with 1-2 Norco for severe pain.  Do not take the Tylenol and Norco together as we discussed.  Rest.  Follow-up with [facility] if you are not getting any better with conservative treatment in 1 week for reevaluation and possible more advanced imaging.

## 2019-06-01 NOTE — ED Triage Notes (Signed)
Patient in today c/o left shoulder pain x 6 days. Patient states he was playing hockey on 05/26/19 and was hit in the left shoulder/chest area with a hockey puck.

## 2019-06-18 ENCOUNTER — Emergency Department
Admission: EM | Admit: 2019-06-18 | Discharge: 2019-06-19 | Disposition: A | Discharge status: Other Acute Inpatient Hospital | Attending: Emergency Medicine | Admitting: Emergency Medicine

## 2019-06-18 ENCOUNTER — Emergency Department

## 2019-06-18 ENCOUNTER — Other Ambulatory Visit: Payer: Self-pay

## 2019-06-18 DIAGNOSIS — Z79899 Other long term (current) drug therapy: Secondary | ICD-10-CM | POA: Insufficient documentation

## 2019-06-18 DIAGNOSIS — T50902A Poisoning by unspecified drugs, medicaments and biological substances, intentional self-harm, initial encounter: Secondary | ICD-10-CM | POA: Insufficient documentation

## 2019-06-18 DIAGNOSIS — F332 Major depressive disorder, recurrent severe without psychotic features: Secondary | ICD-10-CM | POA: Diagnosis not present

## 2019-06-18 DIAGNOSIS — F329 Major depressive disorder, single episode, unspecified: Secondary | ICD-10-CM | POA: Insufficient documentation

## 2019-06-18 DIAGNOSIS — Z20828 Contact with and (suspected) exposure to other viral communicable diseases: Secondary | ICD-10-CM | POA: Diagnosis not present

## 2019-06-18 DIAGNOSIS — F17210 Nicotine dependence, cigarettes, uncomplicated: Secondary | ICD-10-CM | POA: Diagnosis not present

## 2019-06-18 MED ORDER — SODIUM CHLORIDE 0.9 % IV BOLUS
1000.0000 mL | Freq: Once | INTRAVENOUS | Status: AC
  Administered 2019-06-19: 1000 mL via INTRAVENOUS

## 2019-06-18 NOTE — ED Triage Notes (Addendum)
Pt arrives to ED via [facility] from home with c/o overdose after a SA by taking "a handful of medications". EMS reports pt possibly ingested "8 or 9 Advil tablets, 8 or 9 Benadryl tablets, an unknown number of Ritalin tablets, an unknown number of 'seizure' tablets, and an unknown amount of ETOH". EMS states pt was not given Narcan,but was unconscious upon their arrival. EMS reports pt became responsive while en route to the ED. Pt is currently A&Ox4 in NAD; RR even, regular,and unlabored. Of note: EMS reports pt has metal shrapnel in his skull and cannot have an MRI.

## 2019-06-18 NOTE — ED Notes (Signed)
Patient transported to X-ray at this time 

## 2019-06-18 NOTE — ED Provider Notes (Signed)
[facility] Emergency Department Provider Note   ____________________________________________   First MD Initiated Contact with Patient 06/18/19 2326     (approximate)  I have reviewed the triage vital signs and the nursing notes.   HISTORY  Chief Complaint Drug Overdose    HPI Jon Rowland is a 38 y.o. male brought to the ED via EMS status post suicide attempt by intentional overdose.  Patient reports possibly ingesting 8-9 Advil tablets, 8-9 Benadryl tablets, unknown number of Ritalin tablets, unknown number of seizure tablets and large amount of alcohol.  EMS reports patient was unconscious upon their arrival but subsequently became responsive in route to the ED.  Patient did not receive Narcan en route.       Past medical history None  Patient Active Problem List   Diagnosis Date Noted  . MDD (major depressive disorder), recurrent episode, severe (HCC) 06/19/2019    Past Surgical History:  Procedure Laterality Date  . ENUCLEATION Right 2005   Eye  . KNEE CARTILAGE SURGERY  2014  . WISDOM TOOTH EXTRACTION  2004    Prior to Admission medications   Medication Sig Start Date End Date Taking? Authorizing Provider  HYDROcodone-acetaminophen (NORCO/VICODIN) 5-325 MG tablet Take 1-2 tablets by mouth every 6 (six) hours as needed for moderate pain or severe pain. 06/01/19  Yes Domenick Gong, MD  ibuprofen (ADVIL) 600 MG tablet Take 1 tablet (600 mg total) by mouth every 6 (six) hours as needed. 06/01/19  Yes Domenick Gong, MD  tiZANidine (ZANAFLEX) 4 MG tablet Take 1 tablet (4 mg total) by mouth every 8 (eight) hours as needed for muscle spasms. 06/01/19  Yes Domenick Gong, MD    Allergies Patient has no known allergies.  Family History  Problem Relation Age of Onset  . Healthy Mother   . Other Father        unknown medical history    Social History Social History   Tobacco Use  . Smoking status: Current Every Day Smoker   Packs/day: 1.00    Years: 15.00    Pack years: 15.00    Types: Cigarettes  . Smokeless tobacco: Never Used  Substance Use Topics  . Alcohol use: Yes    Alcohol/week: 0.0 standard drinks    Comment: once or twice weekly  . Drug use: No    Review of Systems  Constitutional: No fever/chills Eyes: No visual changes. ENT: No sore throat. Cardiovascular: Denies chest pain. Respiratory: Denies shortness of breath. Gastrointestinal: No abdominal pain.  No nausea, no vomiting.  No diarrhea.  No constipation. Genitourinary: Negative for dysuria. Musculoskeletal: Negative for back pain. Skin: Negative for rash. Neurological: Negative for headaches, focal weakness or numbness. Psychiatric:  Positive for depression with SI.   ____________________________________________   PHYSICAL EXAM:  VITAL SIGNS: ED Triage Vitals [06/18/19 2325]  Enc Vitals Group     BP      Pulse      Resp      Temp      Temp src      SpO2 98 %     Weight      Height      Head Circumference      Peak Flow      Pain Score      Pain Loc      Pain Edu?      Excl. in GC?     Constitutional: Alert and oriented. Well appearing and in mild acute distress. Eyes: Conjunctivae are normal. PERRL.  EOMI. Head: Atraumatic. Nose: No congestion/rhinnorhea. Mouth/Throat: Mucous membranes are moist.  Oropharynx non-erythematous. Neck: No stridor.   Cardiovascular: Normal rate, regular rhythm. Grossly normal heart sounds.  Good peripheral circulation. Respiratory: Normal respiratory effort.  No retractions. Lungs CTAB. Gastrointestinal: Soft and nontender. No distention. No abdominal bruits. No CVA tenderness. Musculoskeletal: No lower extremity tenderness nor edema.  No joint effusions. Neurologic:  Normal speech and language. No gross focal neurologic deficits are appreciated. No gait instability. Skin:  Skin is warm, dry and intact. No rash noted. Psychiatric: Mood and affect are flat, tearful. Speech and  behavior are normal.  ____________________________________________   LABS (all labs ordered are listed, but only abnormal results are displayed)  Labs Reviewed  COMPREHENSIVE METABOLIC PANEL - Abnormal; Notable for the following components:      Result Value   Glucose, Bld 107 (*)    Alkaline Phosphatase 36 (*)    All other components within normal limits  ACETAMINOPHEN LEVEL - Abnormal; Notable for the following components:   Acetaminophen (Tylenol), Serum <10 (*)    All other components within normal limits  ETHANOL - Abnormal; Notable for the following components:   Alcohol, Ethyl (B) 151 (*)    All other components within normal limits  ACETAMINOPHEN LEVEL - Abnormal; Notable for the following components:   Acetaminophen (Tylenol), Serum <10 (*)    All other components within normal limits  SARS CORONAVIRUS 2 (HOSPITAL ORDER, [place] [facility])  SALICYLATE LEVEL  URINE DRUG SCREEN, QUALITATIVE ([place])  CBC WITH DIFFERENTIAL/PLATELET  SALICYLATE LEVEL   ____________________________________________  EKG  ED ECG REPORT I, SUNG,JADE J, the attending physician, personally viewed and interpreted this ECG.   Date: 06/18/2019  EKG Time: 0021  Rate: 72  Rhythm: normal EKG, normal sinus rhythm  Axis: Normal  Intervals:none  ST&T Change: Nonspecific  ____________________________________________  RADIOLOGY  ED MD interpretation: No acute cardiopulmonary process; shrapnel  Official radiology report(s): Dg Chest 1 View  Result Date: 06/18/2019 CLINICAL DATA:  Overdose EXAM: CHEST  1 VIEW COMPARISON:  None. FINDINGS: The heart size and mediastinal contours are within normal limits. Both lungs are clear. The visualized skeletal structures are unremarkable. Triangular opacity over the right lower chest. Amorphous opacity over the right mid upper arm. IMPRESSION: No active disease. Triangular opacity over the right lower chest and small amorphous opacity  over the right mid upper arm are either artifacts or small foreign bodies. Electronically Signed   By: Donavan Foil M.D.   On: 06/18/2019 23:52    ____________________________________________   PROCEDURES  Procedure(s) performed (including Critical Care):  Procedures  CRITICAL CARE Performed by: Paulette Blanch   Total critical care time: 30 minutes  Critical care time was exclusive of separately billable procedures and treating other patients.  Critical care was necessary to treat or prevent imminent or life-threatening deterioration.  Critical care was time spent personally by me on the following activities: development of treatment plan with patient and/or surrogate as well as nursing, discussions with consultants, evaluation of patient's response to treatment, examination of patient, obtaining history from patient or surrogate, ordering and performing treatments and interventions, ordering and review of laboratory studies, ordering and review of radiographic studies, pulse oximetry and re-evaluation of patient's condition. ____________________________________________   INITIAL IMPRESSION / ASSESSMENT AND PLAN / ED COURSE  As part of my medical decision making, I reviewed the following data within the [place] notes reviewed and incorporated, Labs reviewed, EKG interpreted, Old chart  reviewed, Radiograph reviewed, A consult was requested and obtained from this/these consultant(s) Psychiatry and Notes from prior ED visits     Francina AmesDonald Beiser was evaluated in Emergency Department on 06/19/2019 for the symptoms described in the history of present illness. He was evaluated in the context of the global COVID-19 pandemic, which necessitated consideration that the patient might be at risk for infection with the SARS-CoV-2 virus that causes COVID-19. Institutional protocols and algorithms that pertain to the evaluation of patients at risk for COVID-19 are in a state of  rapid change based on information released by regulatory bodies including the CDC and federal and state organizations. These policies and algorithms were followed during the patient's care in the ED.    38 year old male who presents under IVC status post intentional drug overdose.  Will maintain IVC pending medical work-up, psychiatric evaluation and disposition.   Clinical Course as of Jun 19 619  Tue Jun 19, 2019  0035 Patient was evaluated by psychiatric NP; to be admitted after he is medically cleared.   [JS]  0612 Repeat acetaminophen and salicylate levels unremarkable.  Patient is medically cleared for psychiatric disposition.   [JS]    Clinical Course User Index [JS] Irean HongSung, Jade J, MD     ____________________________________________   FINAL CLINICAL IMPRESSION(S) / ED DIAGNOSES  Final diagnoses:  Intentional drug overdose, initial encounter [facility](HCC)     ED Discharge Orders    None       Note:  This document was prepared using Dragon voice recognition software and may include unintentional dictation errors.   Irean HongSung, Jade J, MD 06/19/19 [id number]

## 2019-06-19 ENCOUNTER — Inpatient Hospital Stay
Admission: AD | Admit: 2019-06-19 | Discharge: 2019-06-20 | DRG: 882 | Disposition: A | Discharge status: Home / Self Care | Attending: Psychiatry | Admitting: Psychiatry

## 2019-06-19 ENCOUNTER — Other Ambulatory Visit: Payer: Self-pay

## 2019-06-19 ENCOUNTER — Encounter: Payer: Self-pay | Admitting: Behavioral Health

## 2019-06-19 DIAGNOSIS — F332 Major depressive disorder, recurrent severe without psychotic features: Secondary | ICD-10-CM | POA: Diagnosis present

## 2019-06-19 DIAGNOSIS — F322 Major depressive disorder, single episode, severe without psychotic features: Secondary | ICD-10-CM | POA: Diagnosis present

## 2019-06-19 DIAGNOSIS — Z7289 Other problems related to lifestyle: Secondary | ICD-10-CM

## 2019-06-19 DIAGNOSIS — Z87820 Personal history of traumatic brain injury: Secondary | ICD-10-CM | POA: Diagnosis not present

## 2019-06-19 DIAGNOSIS — F17210 Nicotine dependence, cigarettes, uncomplicated: Secondary | ICD-10-CM | POA: Diagnosis present

## 2019-06-19 DIAGNOSIS — R45851 Suicidal ideations: Secondary | ICD-10-CM | POA: Diagnosis present

## 2019-06-19 DIAGNOSIS — F4310 Post-traumatic stress disorder, unspecified: Principal | ICD-10-CM

## 2019-06-19 DIAGNOSIS — Z20828 Contact with and (suspected) exposure to other viral communicable diseases: Secondary | ICD-10-CM | POA: Diagnosis present

## 2019-06-19 DIAGNOSIS — F1010 Alcohol abuse, uncomplicated: Secondary | ICD-10-CM

## 2019-06-19 DIAGNOSIS — T50902A Poisoning by unspecified drugs, medicaments and biological substances, intentional self-harm, initial encounter: Secondary | ICD-10-CM | POA: Diagnosis not present

## 2019-06-19 LAB — CBC WITH DIFFERENTIAL/PLATELET
Abs Immature Granulocytes: 0.05 10*3/uL (ref 0.00–0.07)
Basophils Absolute: 0.1 10*3/uL (ref 0.0–0.1)
Basophils Relative: 1 %
Eosinophils Absolute: 0.3 10*3/uL (ref 0.0–0.5)
Eosinophils Relative: 4 %
HCT: 45 % (ref 39.0–52.0)
Hemoglobin: 15.5 g/dL (ref 13.0–17.0)
Immature Granulocytes: 1 %
Lymphocytes Relative: 36 %
Lymphs Abs: 2.6 10*3/uL (ref 0.7–4.0)
MCH: 32.8 pg (ref 26.0–34.0)
MCHC: 34.4 g/dL (ref 30.0–36.0)
MCV: 95.3 fL (ref 80.0–100.0)
Monocytes Absolute: 0.6 10*3/uL (ref 0.1–1.0)
Monocytes Relative: 8 %
Neutro Abs: 3.7 10*3/uL (ref 1.7–7.7)
Neutrophils Relative %: 50 %
Platelets: 175 10*3/uL (ref 150–400)
RBC: 4.72 MIL/uL (ref 4.22–5.81)
RDW: 12.8 % (ref 11.5–15.5)
WBC: 7.2 10*3/uL (ref 4.0–10.5)
nRBC: 0 % (ref 0.0–0.2)

## 2019-06-19 LAB — SALICYLATE LEVEL
Salicylate Lvl: <7 mg/dL (ref 2.8–30.0)
Salicylate Lvl: <7 mg/dL (ref 2.8–30.0)

## 2019-06-19 LAB — COMPREHENSIVE METABOLIC PANEL
ALT: 32 U/L (ref 0–44)
AST: 21 U/L (ref 15–41)
Albumin: 4.2 g/dL (ref 3.5–5.0)
Alkaline Phosphatase: 36 U/L — ABNORMAL LOW (ref 38–126)
Anion gap: 9 (ref 5–15)
BUN: 11 mg/dL (ref 6–20)
CO2: 24 mmol/L (ref 22–32)
Calcium: 9.1 mg/dL (ref 8.9–10.3)
Chloride: 107 mmol/L (ref 98–111)
Creatinine, Ser: 0.95 mg/dL (ref 0.61–1.24)
GFR calc Af Amer: >60 mL/min (ref >60)
GFR calc non Af Amer: >60 mL/min (ref >60)
Glucose, Bld: 107 mg/dL — ABNORMAL HIGH (ref 70–99)
Potassium: 3.6 mmol/L (ref 3.5–5.1)
Sodium: 140 mmol/L (ref 135–145)
Total Bilirubin: 0.9 mg/dL (ref 0.3–1.2)
Total Protein: 6.6 g/dL (ref 6.5–8.1)

## 2019-06-19 LAB — URINE DRUG SCREEN, QUALITATIVE (ARMC ONLY)
Amphetamines, Ur Screen: NOT DETECTED
Barbiturates, Ur Screen: NOT DETECTED
Benzodiazepine, Ur Scrn: NOT DETECTED
Cannabinoid 50 Ng, Ur ~~LOC~~: NOT DETECTED
Cocaine Metabolite,Ur ~~LOC~~: NOT DETECTED
MDMA (Ecstasy)Ur Screen: NOT DETECTED
Methadone Scn, Ur: NOT DETECTED
Opiate, Ur Screen: NOT DETECTED
Phencyclidine (PCP) Ur S: NOT DETECTED
Tricyclic, Ur Screen: NOT DETECTED

## 2019-06-19 LAB — ACETAMINOPHEN LEVEL
Acetaminophen (Tylenol), Serum: <10 ug/mL — ABNORMAL LOW (ref 10–30)
Acetaminophen (Tylenol), Serum: <10 ug/mL — ABNORMAL LOW (ref 10–30)

## 2019-06-19 LAB — ETHANOL: Alcohol, Ethyl (B): 151 mg/dL — ABNORMAL HIGH (ref <10)

## 2019-06-19 LAB — SARS CORONAVIRUS 2 BY RT PCR (HOSPITAL ORDER, PERFORMED IN ~~LOC~~ HOSPITAL LAB): SARS Coronavirus 2: NEGATIVE

## 2019-06-19 MED ORDER — TRAZODONE HCL 100 MG PO TABS: 100.0000 mg | ORAL_TABLET | Freq: Every evening | ORAL | Status: DC | PRN

## 2019-06-19 MED ORDER — NICOTINE 21 MG/24HR TD PT24
21.0000 mg | MEDICATED_PATCH | Freq: Every day | TRANSDERMAL | Status: DC
  Administered 2019-06-19 – 2019-06-20 (×2): 21 mg via TRANSDERMAL
  Filled 2019-06-19 (×2): qty 1

## 2019-06-19 MED ORDER — FLUOXETINE HCL 20 MG PO CAPS
20.0000 mg | ORAL_CAPSULE | Freq: Every day | ORAL | Status: DC
  Administered 2019-06-19 – 2019-06-20 (×2): 20 mg via ORAL
  Filled 2019-06-19 (×2): qty 1

## 2019-06-19 MED ORDER — ALUM & MAG HYDROXIDE-SIMETH 200-200-20 MG/5ML PO SUSP: 30.0000 mL | ORAL | Status: DC | PRN

## 2019-06-19 MED ORDER — ACETAMINOPHEN 325 MG PO TABS: 650.0000 mg | ORAL_TABLET | Freq: Four times a day (QID) | ORAL | Status: DC | PRN

## 2019-06-19 MED ORDER — MAGNESIUM HYDROXIDE 400 MG/5ML PO SUSP: 30.0000 mL | Freq: Every day | ORAL | Status: DC | PRN

## 2019-06-19 NOTE — ED Notes (Addendum)
Pt sleeping. Chest rise and fall visible. Bed locked low. Rail up. May not need to move to [facility] as per psych/counselor bed has become available in inpt psych. Will notify charge RN.

## 2019-06-19 NOTE — Tx Team (Signed)
Initial Treatment Plan 06/19/2019 4:08 PM Arabella Merles [id number]    PATIENT STRESSORS: Financial difficulties Marital or family conflict   PATIENT STRENGTHS: Ability for insight Communication skills General fund of knowledge Supportive family/friends Work skills   PATIENT IDENTIFIED PROBLEMS: Depression/Anxiety  Family issues  Lapse in judgment                 DISCHARGE CRITERIA:  Ability to meet basic life and health needs Improved stabilization in mood, thinking, and/or behavior Need for constant or close observation no longer present Reduction of life-threatening or endangering symptoms to within safe limits  PRELIMINARY DISCHARGE PLAN: Outpatient therapy Return to previous living arrangement Return to previous work or school arrangements  PATIENT/FAMILY INVOLVEMENT: This treatment plan has been presented to and reviewed with the patient, Muhsin Doris. The patient has been given the opportunity to ask questions and make suggestions.  Catina Nuss, RN 06/19/2019, 4:08 PM

## 2019-06-19 NOTE — ED Notes (Signed)
Pt transferred into ED BHU room 4   Patient assigned to appropriate care area. Patient oriented to unit/care area: Informed that, for his safety, care areas are designed for safety and monitored by security cameras at all times; Visiting hours and phone times explained to patient. Patient verbalizes understanding, and verbal contract for safety obtained.   Assessment completed  He denies pain   
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
## 2019-06-19 NOTE — Plan of Care (Signed)
New admission.  Problem: Education: Goal: Knowledge of [place] [facility] Education information/materials will improve Outcome: Not Progressing Goal: Emotional status will improve Outcome: Not Progressing Goal: Mental status will improve Outcome: Not Progressing Goal: Verbalization of understanding the information provided will improve Outcome: Not Progressing   Problem: Health Behavior/Discharge Planning: Goal: Compliance with treatment plan for underlying cause of condition will improve Outcome: Not Progressing   Problem: Safety: Goal: Periods of time without injury will increase Outcome: Not Progressing   Problem: Coping: Goal: Coping ability will improve Outcome: Not Progressing Goal: Will verbalize feelings Outcome: Not Progressing   Problem: Self-Concept: Goal: Level of anxiety will decrease Outcome: Not Progressing   Problem: Self-Concept: Goal: Ability to identify factors that promote anxiety will improve Outcome: Not Progressing Goal: Level of anxiety will decrease Outcome: Not Progressing Goal: Ability to modify response to factors that promote anxiety will improve Outcome: Not Progressing

## 2019-06-19 NOTE — ED Notes (Signed)
Per Dr Beather Arbour, pt is medically cleared at this time and ready for psychiatric treatment/care.

## 2019-06-19 NOTE — ED Notes (Signed)
Patient given breakfast tray.

## 2019-06-19 NOTE — BH Assessment (Signed)
Patient is to be admitted to [facility] by Dr. Claris Gower.  Attending Physician will be Dr. Weber Cooks.   Patient has been assigned to room 303, by [place].   Intake Paper Work has been signed and placed on patient chart.   ER staff is aware of the admission:  N[place], ER Secretary    Dr. Kerman Passey, ER MD   Amy T., Patient's Nurse   Butch Penny, Patient Access.

## 2019-06-19 NOTE — ED Notes (Signed)
Wife has 5 more minutes at bedside visit then pt being moved to [facility].

## 2019-06-19 NOTE — ED Notes (Signed)
Pt requesting to speak with MD. Pt advised that I will let his nurse know. RN Amy informed.

## 2019-06-19 NOTE — Plan of Care (Signed)
Patient newly admitted, hasn't had time to progress.   Problem: Education: Goal: Knowledge of [place] [facility] Education information/materials will improve Outcome: Not Progressing Goal: Emotional status will improve Outcome: Not Progressing Goal: Mental status will improve Outcome: Not Progressing Goal: Verbalization of understanding the information provided will improve Outcome: Not Progressing   Problem: Health Behavior/Discharge Planning: Goal: Compliance with treatment plan for underlying cause of condition will improve Outcome: Not Progressing   Problem: Safety: Goal: Periods of time without injury will increase Outcome: Not Progressing   Problem: Coping: Goal: Coping ability will improve Outcome: Not Progressing Goal: Will verbalize feelings Outcome: Not Progressing   

## 2019-06-19 NOTE — ED Notes (Signed)
BEHAVIORAL HEALTH ROUNDING Patient sleeping: No. Patient alert and oriented: yes Behavior appropriate: Yes.  ; If no, describe:  Nutrition and fluids offered: yes Toileting and hygiene offered: Yes  Sitter present: q15 minute observations and security camera monitoring   

## 2019-06-19 NOTE — ED Notes (Signed)
Report given to Amy, RN [id number].

## 2019-06-19 NOTE — ED Notes (Signed)

## 2019-06-19 NOTE — ED Notes (Addendum)
Pt belongings: 1 white shirt, 1 pr black tennis shoes, 1 pr black socks, 1 pr grey striped boxers, 1 pr tan shorts, 1 green belt, 1 red lighter, 1 orange silicone wristband. Pt allowed to keep wedding band and woven bracelet that cannot be removed without cutting/destroying it. Pt's belongings bagged and labeled per policy.

## 2019-06-19 NOTE — Progress Notes (Signed)
Admission Note:  D: Pt appeared depressed  With  a flat affect.  Pt  denies SI / AVH at this time.  Patient  Admitted under the service of Dr. Weber Cooks . Patient overdose on a multitude of medication.  Patient denies suicidal ideations at present time. Patient  Stated  Stressor of wife having MS brain tumor and having seizures  his job that he may have lost . Patient drinks  2-3  Beers daily . Pt is redirectable and cooperative with assessment.      A: Pt admitted to unit per protocol, skin assessment and search done and no contraband found. In [employer]  He  Lost  His eye .  Pt   educated on therapeutic milieu rules. Pt was introduced to milieu by nursing staff.   Medical History:  Major Depression  Allergies : NKA   R: Pt was receptive to education about the milieu .  15 min safety checks started. Probation officer offered support

## 2019-06-19 NOTE — BH Assessment (Signed)
Assessment Note  Jon Rowland is an 38 y.o. male who presents to the ER due to having an intentional overdose on his medications. He reports of having the thoughts and feelings of helplessness, hopelessness and worthlessness. Patient states, he believes his family will be better off without him. His wife and children can get the insurance money and he's wife can find another man who is able to take care of her. Patient has had thoughts for an extended period of time. However, this past weekend the patient may have lost his job. His wife was sick and EMS had to be called. Patient had to call out from work, to take care of his wife. Patient states, his employer refused to call him back or respond to his text messages. Thus, the thoughts of him being worthless increased and it resulted in him attempting to end his life.  During the interview, the patient was calm, cooperative and pleasant. He was able to provide appropriate answers to the questions. He admits to the use of alcohol and cannabis. He last drink, was prior to coming to the ER. During the time of this interview, his BAC hadn't resulted.  Patient denies HI and AV/H.   Diagnosis: Major Depression  Past Medical History: History reviewed. No pertinent past medical history.  Past Surgical History:  Procedure Laterality Date  . ENUCLEATION Right 2005   Eye  . KNEE CARTILAGE SURGERY  2014  . WISDOM TOOTH EXTRACTION  2004    Family History:  Family History  Problem Relation Age of Onset  . Healthy Mother   . Other Father        unknown medical history    Social History:  reports that he has been smoking cigarettes. He has a 15.00 pack-year smoking history. He has never used smokeless tobacco. He reports current alcohol use. He reports that he does not use drugs.  Additional Social History:  Alcohol / Drug Use Pain Medications: See PTA Prescriptions: See PTA Over the Counter: See PTA History of alcohol / drug use?: Yes Longest  period of sobriety (when/how long): Unable to quantify Substance #1 Name of Substance 1: Alcohol Substance #2 Name of Substance 2: Cannabis  CIWA: CIWA-Ar BP: 129/81 Pulse Rate: 71 COWS:    Allergies: No Known Allergies  Home Medications: (Not in a hospital admission)   OB/GYN Status:  No LMP for male patient.  General Assessment Data Location of Assessment: [facility] ED TTS Assessment: In system Is this a Tele or Face-to-Face Assessment?: Face-to-Face Is this an Initial Assessment or a Re-assessment for this encounter?: Initial Assessment Language Other than English: No Living Arrangements: Other (Comment)(Private Home) What gender do you identify as?: Male Marital status: Married Pregnancy Status: No Living Arrangements: Spouse/significant other, Children Can pt return to current living arrangement?: Yes Admission Status: Involuntary Petitioner: ED Attending Is patient capable of signing voluntary admission?: No(Under IVC) Referral Source: Self/Family/Friend Insurance type: T[employer]are  Medical Screening Exam [facility] Walk-in ONLY) Medical Exam completed: Yes  Crisis Care Plan Living Arrangements: Spouse/significant other, Children Name of Psychiatrist: Reports of none Name of Therapist: Reports of none  Education Status Is patient currently in school?: No Is the patient employed, unemployed or receiving disability?: Employed  Risk to self with the past 6 months Suicidal Ideation: Yes-Currently Present Has patient been a risk to self within the past 6 months prior to admission? : Yes Suicidal Intent: Yes-Currently Present Has patient had any suicidal intent within the past 6 months prior to admission? :  Yes Is patient at risk for suicide?: Yes Suicidal Plan?: Yes-Currently Present Has patient had any suicidal plan within the past 6 months prior to admission? : Yes Specify Current Suicidal Plan: Overdose on medication Access to Means: Yes Specify Access to Suicidal  Means: Overdose on medications What has been your use of drugs/alcohol within the last 12 months?: Alcohol & Cannabis Previous Attempts/Gestures: No How many times?: 1 Other Self Harm Risks: Reports of none Triggers for Past Attempts: Family contact, Other personal contacts, Other (Comment) Intentional Self Injurious Behavior: None Family Suicide History: Unknown Recent stressful life event(s): Job Loss, Conflict (Comment), Financial Problems, Other (Comment) Persecutory voices/beliefs?: No Depression: Yes Depression Symptoms: Tearfulness, Isolating, Loss of interest in usual pleasures, Feeling worthless/self pity Substance abuse history and/or treatment for substance abuse?: Yes Suicide prevention information given to non-admitted patients: Not applicable  Risk to Others within the past 6 months Homicidal Ideation: No Does patient have any lifetime risk of violence toward others beyond the six months prior to admission? : No Thoughts of Harm to Others: No Current Homicidal Intent: No Current Homicidal Plan: No Access to Homicidal Means: No Identified Victim: Reports of none History of harm to others?: No Assessment of Violence: None Noted Violent Behavior Description: Reports of none Does patient have access to weapons?: No Criminal Charges Pending?: No Does patient have a court date: No Is patient on probation?: No  Psychosis Hallucinations: None noted Delusions: None noted  Mental Status Report Appearance/Hygiene: Unremarkable, In scrubs Eye Contact: Fair Motor Activity: Freedom of movement, Unremarkable Speech: Logical/coherent, Unremarkable Level of Consciousness: Alert Mood: Depressed, Sad, Pleasant Affect: Appropriate to circumstance, Depressed, Sad Anxiety Level: Minimal Thought Processes: Coherent, Relevant Judgement: Partial Orientation: Person, Place, Time, Situation, Appropriate for developmental age Obsessive Compulsive Thoughts/Behaviors:  Moderate  Cognitive Functioning Concentration: Decreased Memory: Recent Intact, Remote Intact Is patient IDD: No Insight: Fair Impulse Control: Fair Appetite: Fair Have you had any weight changes? : No Change Sleep: Decreased Total Hours of Sleep: 5 Vegetative Symptoms: None  ADLScreening [facility] Assessment Services) Patient's cognitive ability adequate to safely complete daily activities?: Yes Patient able to express need for assistance with ADLs?: Yes Independently performs ADLs?: Yes (appropriate for developmental age)  Prior Inpatient Therapy Prior Inpatient Therapy: No  Prior Outpatient Therapy Prior Outpatient Therapy: No Does patient have an ACCT team?: No Does patient have Intensive In-House Services?  : No Does patient have [facility] services? : No Does patient have P4CC services?: No  ADL Screening (condition at time of admission) Patient's cognitive ability adequate to safely complete daily activities?: Yes Is the patient deaf or have difficulty hearing?: No Does the patient have difficulty seeing, even when wearing glasses/contacts?: No Does the patient have difficulty concentrating, remembering, or making decisions?: No Patient able to express need for assistance with ADLs?: Yes Does the patient have difficulty dressing or bathing?: No Independently performs ADLs?: Yes (appropriate for developmental age) Does the patient have difficulty walking or climbing stairs?: No Weakness of Legs: None Weakness of Arms/Hands: None  Home Assistive Devices/Equipment Home Assistive Devices/Equipment: None  Therapy Consults (therapy consults require a physician order) PT Evaluation Needed: No OT Evalulation Needed: No SLP Evaluation Needed: No Abuse/Neglect Assessment (Assessment to be complete while patient is alone) Abuse/Neglect Assessment Can Be Completed: Yes Physical Abuse: Denies Verbal Abuse: Denies Sexual Abuse: Denies Exploitation of patient/patient's resources:  Denies Self-Neglect: Denies Values / Beliefs Cultural Requests During Hospitalization: None Spiritual Requests During Hospitalization: None Consults Spiritual Care Consult Needed: No Social Work  Consult Needed: No Advance Directives (For Healthcare) Does Patient Have a Medical Advance Directive?: No       Child/Adolescent Assessment Running Away Risk: Denies(Patient is an adult )  Disposition:  Disposition Initial Assessment Completed for this Encounter: Yes  On Site Evaluation by:   Reviewed with Physician:    Gunnar Fusi MS, LCAS, [facility], [place] Therapeutic Triage Specialist 06/19/2019 1:41 AM
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
## 2019-06-19 NOTE — ED Notes (Signed)
ED  Is the patient under IVC or is there intent for IVC: Yes.   Is the patient medically cleared: Yes.   Is there vacancy in the ED BHU: Yes.   Is the population mix appropriate for patient: Yes.   Is the patient awaiting placement in inpatient or outpatient setting: Yes.  Awaiting inpt bed to be assigned  Has the patient had a psychiatric consult: Yes.   Survey of unit performed for contraband, proper placement and condition of furniture, tampering with fixtures in bathroom, shower, and each patient room: Yes.  ; Findings:  APPEARANCE/BEHAVIOR Calm and cooperative NEURO ASSESSMENT Orientation: oriented x3  Denies pain Hallucinations: No.None noted (Hallucinations)  denies Speech: Normal Gait: normal RESPIRATORY ASSESSMENT Even  Unlabored respirations  CARDIOVASCULAR ASSESSMENT Pulses equal   regular rate  Skin warm and dry   GASTROINTESTINAL ASSESSMENT no GI complaint EXTREMITIES Full ROM  PLAN OF CARE Provide calm/safe environment. Vital signs assessed twice daily. ED BHU Assessment once each 12-hour shift. Assure the ED provider has rounded once each shift. Provide and encourage hygiene. Provide redirection as needed. Assess for escalating behavior; address immediately and inform ED provider.  Assess family dynamic and appropriateness for visitation as needed: Yes.  ; If necessary, describe findings:  Educate the patient/family about BHU procedures/visitation: Yes.  ; If necessary, describe findings:

## 2019-06-19 NOTE — BH Assessment (Signed)
Writer called and spoke with Jon Rowland ext. [phone]), currently have no beds, advised to call back after 9am.

## 2019-06-19 NOTE — ED Notes (Signed)
Pt resting calmly in bed.  
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
## 2019-06-19 NOTE — ED Notes (Signed)
Pt's wife updated on pt's condition and plan of care at this time.

## 2019-06-19 NOTE — Progress Notes (Signed)
D - Patient was on the phone upon arrival to the unit. Patient was pleasant during assessment. Patient denies SI/HI/AVH, pain, anxiety and depression with this Probation officer. Patient was isolative to his room this evening but was observed interacting appropriate with staff and peers when was out. Patient was on the phone several times this evening, acting appropriately.   A - Patient didn't have any medications scheduled this evening. Patient given education. Patient given support and encouragement to be active in his treatment plan. Patient informed to staff know if there are any issues or problems on the unit.   R - Patient being monitored Q 15 minutes for safety per unit protocol. Patient remains safe on the unit.

## 2019-06-20 MED ORDER — TRAZODONE HCL 100 MG PO TABS: 100.0000 mg | ORAL_TABLET | Freq: Every evening | ORAL | 1 refills | Status: DC | PRN

## 2019-06-20 MED ORDER — FLUOXETINE HCL 20 MG PO CAPS: 20.0000 mg | ORAL_CAPSULE | Freq: Every day | ORAL | 1 refills | Status: AC

## 2019-06-20 NOTE — Plan of Care (Signed)
  Problem: Education: Goal: Knowledge of [facility] Education information/materials will improve Outcome: Adequate for Discharge Goal: Emotional status will improve Outcome: Adequate for Discharge Goal: Mental status will improve Outcome: Adequate for Discharge Goal: Verbalization of understanding the information provided will improve Outcome: Adequate for Discharge   Problem: Health Behavior/Discharge Planning: Goal: Compliance with treatment plan for underlying cause of condition will improve Outcome: Adequate for Discharge   Problem: Safety: Goal: Periods of time without injury will increase Outcome: Adequate for Discharge   Problem: Safety: Goal: Periods of time without injury will increase Outcome: Adequate for Discharge   Problem: Coping: Goal: Coping ability will improve Outcome: Adequate for Discharge Goal: Will verbalize feelings Outcome: Adequate for Discharge   Problem: Self-Concept: Goal: Level of anxiety will decrease Outcome: Adequate for Discharge   Problem: Self-Concept: Goal: Ability to identify factors that promote anxiety will improve Outcome: Adequate for Discharge Goal: Level of anxiety will decrease Outcome: Adequate for Discharge Goal: Ability to modify response to factors that promote anxiety will improve Outcome: Adequate for Discharge
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
## 2019-06-20 NOTE — Progress Notes (Signed)
  [facility] Adult Case Management Discharge Plan :  Will you be returning to the same living situation after discharge:  Yes,  pt reports that he is returning home. At discharge, do you have transportation home?: Yes,  pt wife is providing transportation. Do you have the ability to pay for your medications: Yes,  [employer]  Release of information consent forms completed and in the chart;  Patient's signature needed at discharge.  Patient to Follow up at: Follow-up Information    [facility] Follow up on 07/02/2019.   Why: Please attend your appointment 07/02/2019 at 9:30.  Thank you! Contact information: [place] [address] [phone]           Next level of care provider has access to [place] and Suicide Prevention discussed: No.  Pt declined SPE at this time.   Have you used any form of tobacco in the last 30 days? (Cigarettes, Smokeless Tobacco, Cigars, and/or Pipes): Yes  Has patient been referred to the Quitline?: Patient refused referral  Patient has been referred for addiction treatment: Ashley, LCSW 06/20/2019, 9:54 AM

## 2019-06-20 NOTE — Progress Notes (Signed)
Recreation Therapy Notes  Date: 06/20/2019  Time: 9:30 am   Location: Craft room  Behavioral response: Appropriate   Intervention Topic: Emotions  Discussion/Intervention:  Group content on today was focused on emotions. The group identified what emotions are and why it is important to have emotions. Patients expressed some positive and negative emotions. Individuals gave some past experiences on how they normally dealt with emotions in the past. The group described some positive ways to deal with emotions in the future. Patients participated in the intervention "Name the Jerl Santos" where individuals were given a chance to experience different emotions.  Clinical Observations/Feedback:  Patient came to group late due to unknown reasons. Individual was social with peers and staff while participating in the intervention.    Lashelle Koy LRT/CTRS         Ailen Strauch 06/20/2019 11:16 AM
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
## 2019-06-20 NOTE — Progress Notes (Signed)
D: Patient is aware of  Discharge this shift  A:.Patient denies suicidal /homicidal ideations. Patient received all belongings brought in   No Storage medications. Writer reviewed Discharge Summary, Suicide Risk Assessment, and Transitional Record.  Aware  Of follow up appointment . R: Patient left unit with no questions  Or concerns  With wife.

## 2019-06-20 NOTE — Discharge Summary (Signed)
Physician Discharge Summary Note  Patient:  Jon Rowland is an 38 y.o., male MRN:  [id number] DOB:  Aug 12, 1981 Patient phone:  [phone] (home)  Patient address:   [address] [address],  Total Time spent with patient: 30 minutes  Date of Admission:  06/19/2019 Date of Discharge: 06/20/2019 11  Reason for Admission: Patient was admitted through the emergency room where he presented after EMS were called for a suicide attempt  Principal Problem: PTSD (post-traumatic stress disorder) Discharge Diagnoses: Principal Problem:   PTSD (post-traumatic stress disorder) Active Problems:   MDD (major depressive disorder), severe ([place])   Alcohol abuse   History of traumatic brain injury   Past Psychiatric History: Patient has a history of being diagnosed with PTSD also a history of traumatic brain injury.  Has had psychiatric outpatient treatment in the past.  Denies past suicide attempts.  Not currently engaged in any treatment  Past Medical History: History reviewed. No pertinent past medical history.  Past Surgical History:  Procedure Laterality Date  . ENUCLEATION Right 2005   Eye  . KNEE CARTILAGE SURGERY  2014  . WISDOM TOOTH EXTRACTION  2004   Family History:  Family History  Problem Relation Age of Onset  . Healthy Mother   . Other Father        unknown medical history   Family Psychiatric  History: Patient reported that his father had an alcohol problem and probably mood problems as well Social History:  Social History   Substance and Sexual Activity  Alcohol Use Yes  . Alcohol/week: 0.0 standard drinks   Comment: once or twice weekly     Social History   Substance and Sexual Activity  Drug Use No    Social History   Socioeconomic History  . Marital status: Married    Spouse name: Not on file  . Number of children: Not on file  . Years of education: Not on file  . Highest education level: Not on file  Occupational History  . Not on file   Social Needs  . Financial resource strain: Not on file  . Food insecurity    Worry: Not on file    Inability: Not on file  . Transportation needs    Medical: Not on file    Non-medical: Not on file  Tobacco Use  . Smoking status: Current Every Day Smoker    Packs/day: 1.00    Years: 15.00    Pack years: 15.00    Types: Cigarettes  . Smokeless tobacco: Never Used  Substance and Sexual Activity  . Alcohol use: Yes    Alcohol/week: 0.0 standard drinks    Comment: once or twice weekly  . Drug use: No  . Sexual activity: Not on file  Lifestyle  . Physical activity    Days per week: Not on file    Minutes per session: Not on file  . Stress: Not on file  Relationships  . Social Herbalist on phone: Not on file    Gets together: Not on file    Attends religious service: Not on file    Active member of club or organization: Not on file    Attends meetings of clubs or organizations: Not on file    Relationship status: Not on file  Other Topics Concern  . Not on file  Social History Narrative  . Not on file    Hospital Course: Patient admitted to the psychiatric unit.  Cooperative with admission  procedure.  15-minute checks were used for safety and the patient showed no dangerous or aggressive behavior during his time in the hospital.  Patient from the beginning was very focused on wanting to be discharged.  Felt like being in the hospital was making him more anxious.  He absolutely denied any suicidal thoughts intent or plan.  Denied any psychotic symptoms.  He was cooperative and open to the idea of receiving outpatient psychotherapy and a starting medication.  We discussed various medication options given his complicated history but suggested that a modest dose of fluoxetine would be a good starting point.  Patient was agreeable.  Patient met with representative from [facility].  He agreed to outpatient follow-up for therapy and potentially medicine management.  I spoke to his  wife last night and she was strongly in favor of discharge.  She reported feeling that he was not at elevated risk of suicide or violence and felt very safe and comfortable with his coming home.  Physical Findings: AIMS:  , ,  ,  ,    CIWA:  CIWA-Ar Total: 0 COWS:     Musculoskeletal: Strength & Muscle Tone: within normal limits Gait & Station: normal Patient leans: N/A  Psychiatric Specialty Exam: Physical Exam  Nursing note and vitals reviewed. Constitutional: He appears well-developed and well-nourished.  HENT:  Head: Normocephalic and atraumatic.  Eyes: Pupils are equal, round, and reactive to light. Conjunctivae are normal.    Neck: Normal range of motion.  Cardiovascular: Regular rhythm and normal heart sounds.  Respiratory: Effort normal.  GI: Soft.  Musculoskeletal: Normal range of motion.  Neurological: He is alert.  Skin: Skin is warm and dry.  Psychiatric: He has a normal mood and affect. His speech is normal and behavior is normal. Judgment and thought content normal. Cognition and memory are normal.    Review of Systems  Constitutional: Negative.   HENT: Negative.   Eyes: Negative.   Respiratory: Negative.   Cardiovascular: Negative.   Gastrointestinal: Negative.   Musculoskeletal: Negative.   Skin: Negative.   Neurological: Negative.   Psychiatric/Behavioral: Negative.     Blood pressure (!) 139/106, pulse 72, temperature 97.6 F (36.4 C), temperature source Oral, resp. rate 18, height 6' (1.829 m), weight 73.9 kg, SpO2 100 %.Body mass index is 22.11 kg/m.  General Appearance: Casual  Eye Contact:  Good  Speech:  Clear and Coherent  Volume:  Normal  Mood:  Euthymic  Affect:  Congruent  Thought Process:  Goal Directed  Orientation:  Full (Time, Place, and Person)  Thought Content:  Logical  Suicidal Thoughts:  No  Homicidal Thoughts:  No  Memory:  Immediate;   Fair Recent;   Fair Remote;   Fair  Judgement:  Fair  Insight:  Fair  Psychomotor  Activity:  Normal  Concentration:  Concentration: Fair  Recall:  [employer] of Knowledge:  Fair  Language:  Fair  Akathisia:  No  Handed:  Right  AIMS (if indicated):     Assets:  Desire for Improvement Housing Physical Health  ADL's:  Intact  Cognition:  WNL  Sleep:  Number of Hours: 7.15     Have you used any form of tobacco in the last 30 days? (Cigarettes, Smokeless Tobacco, Cigars, and/or Pipes): Yes  Has this patient used any form of tobacco in the last 30 days? (Cigarettes, Smokeless Tobacco, Cigars, and/or Pipes) Yes, Yes, A prescription for an FDA-approved tobacco cessation medication was offered at discharge and the patient refused  Blood Alcohol level:  Lab Results  Component Value Date   ETH 151 (H) 54/83/2346    Metabolic Disorder Labs:  No results found for: HGBA1C, MPG No results found for: PROLACTIN No results found for: CHOL, TRIG, HDL, CHOLHDL, VLDL, LDLCALC  See Psychiatric Specialty Exam and Suicide Risk Assessment completed by Attending Physician prior to discharge.  Discharge destination:  Home  Is patient on multiple antipsychotic therapies at discharge:  No   Has Patient had three or more failed trials of antipsychotic monotherapy by history:  No  Recommended Plan for Multiple Antipsychotic Therapies: NA  Discharge Instructions    Diet - low sodium heart healthy   Complete by: As directed    Increase activity slowly   Complete by: As directed      Allergies as of 06/20/2019   No Known Allergies     Medication List    STOP taking these medications   HYDROcodone-acetaminophen 5-325 MG tablet Commonly known as: NORCO/VICODIN   ibuprofen 600 MG tablet Commonly known as: ADVIL   tiZANidine 4 MG tablet Commonly known as: ZANAFLEX     TAKE these medications     Indication  FLUoxetine 20 MG capsule Commonly known as: PROZAC Take 1 capsule (20 mg total) by mouth daily. Start taking on: June 21, 2019  Indication: Depression    traZODone 100 MG tablet Commonly known as: DESYREL Take 1 tablet (100 mg total) by mouth at bedtime as needed for sleep.  Indication: [facility] Follow up on 07/02/2019.   Why: Please attend your appointment 07/02/2019 at 9:30.  Thank you! Contact information: [place] [address] [phone]           Follow-up recommendations:  Activity:  Activity as tolerated Diet:  Regular diet Other:  Continue trial of current medicine follow-up with [facility] at discharge.  Comments: Prescriptions for medication given at discharge.  Patient strongly encouraged to discontinue use of alcohol.  Patient was advised that it is safe for her to have firearms out of reach especially for patients with any history of impulsivity or suicidal behavior.  Signed: Alethia Berthold, MD 06/20/2019, 1:31 PM

## 2019-06-20 NOTE — [facility] Suicide Risk Assessment (Signed)
[place] INPATIENT:  Family/Significant Other Suicide Prevention Education  Suicide Prevention Education:  Patient Refusal for Family/Significant Other Suicide Prevention Education: The patient Bret Vanessen has refused to provide written consent for family/significant other to be provided Family/Significant Other Suicide Prevention Education during admission and/or prior to discharge.  Physician notified.  SPE completed with pt, as pt refused to consent to family contact. SPI pamphlet provided to pt and pt was encouraged to share information with support network, ask questions, and talk about any concerns relating to SPE. Pt denies access to guns/firearms and verbalized understanding of information provided. Mobile Crisis information also provided to pt.   Rozann Lesches 06/20/2019, 10:27 AM

## 2020-02-04 ENCOUNTER — Ambulatory Visit
Admission: EM | Admit: 2020-02-04 | Discharge: 2020-02-04 | Disposition: A | Discharge status: Home / Self Care | Attending: Family Medicine | Admitting: Family Medicine

## 2020-02-04 ENCOUNTER — Other Ambulatory Visit: Payer: Self-pay

## 2020-02-04 DIAGNOSIS — W57XXXA Bitten or stung by nonvenomous insect and other nonvenomous arthropods, initial encounter: Secondary | ICD-10-CM | POA: Diagnosis not present

## 2020-02-04 DIAGNOSIS — S30861A Insect bite (nonvenomous) of abdominal wall, initial encounter: Secondary | ICD-10-CM | POA: Diagnosis not present

## 2020-02-04 DIAGNOSIS — L089 Local infection of the skin and subcutaneous tissue, unspecified: Secondary | ICD-10-CM

## 2020-02-04 MED ORDER — MUPIROCIN 2 % EX OINT: 1.0000 "application " | TOPICAL_OINTMENT | Freq: Two times a day (BID) | CUTANEOUS | 0 refills | Status: AC

## 2020-02-04 MED ORDER — DOXYCYCLINE HYCLATE 100 MG PO CAPS: 100.0000 mg | ORAL_CAPSULE | Freq: Two times a day (BID) | ORAL | 0 refills | Status: DC

## 2020-02-04 MED ORDER — TRIAMCINOLONE ACETONIDE 0.5 % EX OINT: 1.0000 "application " | TOPICAL_OINTMENT | Freq: Two times a day (BID) | CUTANEOUS | 0 refills | Status: AC

## 2020-02-04 NOTE — Discharge Instructions (Signed)
If you worsen, go to the hospital.  Medications as prescribed.  Take care  Dr. Adriana Simas

## 2020-02-04 NOTE — ED Provider Notes (Addendum)
[facility]    CSN: [id number] Arrival date & time: 02/04/20  7829   History   Chief Complaint Chief Complaint  Patient presents with  . Tick Bite   HPI 39 year old male presents with a tick bite.  Patient reports that he suffered a tick bite on Saturday.  He was bitten at the umbilicus.  Patient now has surrounding erythema, warmth, and itching.  He also has had some drainage from the umbilicus.  Pain 6/10 in severity.  No relieving factors.  Patient is concerned that he has a tickborne illness.  No documented fever.  No other associated symptoms.  No other complaints.  Patient Active Problem List   Diagnosis Date Noted  . MDD (major depressive disorder), recurrent episode, severe ([place]) 06/19/2019  . MDD (major depressive disorder), severe ([place]) 06/19/2019  . PTSD (post-traumatic stress disorder) 06/19/2019  . Alcohol abuse 06/19/2019  . History of traumatic brain injury 06/19/2019   Past Surgical History:  Procedure Laterality Date  . ENUCLEATION Right 2005   Eye  . KNEE CARTILAGE SURGERY  2014  . [place] EXTRACTION  2004   Home Medications    Prior to Admission medications   Medication Sig Start Date End Date Taking? Authorizing Provider  FLUoxetine (PROZAC) 20 MG capsule Take 1 capsule (20 mg total) by mouth daily. 06/21/19  Yes Clapacs, Madie Reno, MD  doxycycline (VIBRAMYCIN) 100 MG capsule Take 1 capsule (100 mg total) by mouth 2 (two) times daily. 02/04/20   Coral Spikes, DO  mupirocin ointment (BACTROBAN) 2 % Apply 1 application topically 2 (two) times daily for 7 days. 02/04/20 02/11/20  Coral Spikes, DO  triamcinolone ointment (KENALOG) 0.5 % Apply 1 application topically 2 (two) times daily. 02/04/20   Coral Spikes, DO  traZODone (DESYREL) 100 MG tablet Take 1 tablet (100 mg total) by mouth at bedtime as needed for sleep. 06/20/19 02/04/20  Clapacs, Madie Reno, MD    Family History Family History  Problem Relation Age of Onset  . Healthy Mother   .  Other Father        unknown medical history    Social History Social History   Tobacco Use  . Smoking status: Current Every Day Smoker    Packs/day: 1.00    Years: 15.00    Pack years: 15.00    Types: Cigarettes  . Smokeless tobacco: Never Used  Substance Use Topics  . Alcohol use: Yes    Alcohol/week: 0.0 standard drinks    Comment: once or twice weekly  . Drug use: No     Allergies   Patient has no known allergies.   Review of Systems Review of Systems  Constitutional: Negative.   Skin:       Tick bite, rash.   Physical Exam Triage Vital Signs ED Triage Vitals  Enc Vitals Group     BP 02/04/20 0851 (!) 140/99     Pulse Rate 02/04/20 0851 73     Resp 02/04/20 0851 18     Temp 02/04/20 0851 98 F (36.7 C)     Temp Source 02/04/20 0851 Oral     SpO2 02/04/20 0851 97 %     Weight 02/04/20 0849 165 lb (74.8 kg)     Height 02/04/20 0849 6' (1.829 m)     Head Circumference --      Peak Flow --      Pain Score 02/04/20 0848 6     Pain Loc --  Pain Edu? --      Excl. in GC? --    Updated Vital Signs BP (!) 140/99 (BP Location: Right Arm)   Pulse 73   Temp 98 F (36.7 C) (Oral)   Resp 18   Ht 6' (1.829 m)   Wt 74.8 kg   SpO2 97%   BMI 22.38 kg/m   Visual Acuity Right Eye Distance:   Left Eye Distance:   Bilateral Distance:    Right Eye Near:   Left Eye Near:    Bilateral Near:     Physical Exam Vitals and nursing note reviewed.  Constitutional:      General: He is not in acute distress.    Appearance: Normal appearance. He is not ill-appearing.  HENT:     Head: Normocephalic and atraumatic.  Eyes:     General:        Right eye: No discharge.        Left eye: No discharge.     Conjunctiva/sclera: Conjunctivae normal.  Cardiovascular:     Rate and Rhythm: Normal rate and regular rhythm.  Pulmonary:     Effort: Pulmonary effort is normal.     Breath sounds: Normal breath sounds. No wheezing, rhonchi or rales.  Abdominal:     General:  There is no distension.     Palpations: Abdomen is soft.     Comments: Patient with drainage from the umbilicus with surrounding erythema and induration.  Neurological:     Mental Status: He is alert.  Psychiatric:        Mood and Affect: Mood normal.        Behavior: Behavior normal.    UC Treatments / Results  Labs (all labs ordered are listed, but only abnormal results are displayed) Labs Reviewed - No data to display  EKG   Radiology No results found.  Procedures Procedures (including critical care time)  Medications Ordered in UC Medications - No data to display  Initial Impression / Assessment and Plan / UC Course  I have reviewed the triage vital signs and the nursing notes.  Pertinent labs & imaging results that were available during my care of the patient were reviewed by me and considered in my medical decision making (see chart for details).    39 year old male presents with an infected tick bite of the abdominal wall.  Placed on doxycycline.  Bactroban ointment as well.  Triamcinolone ointment for itching.  Final Clinical Impressions(s) / UC Diagnoses   Final diagnoses:  Infected tick bite of abdominal wall, initial encounter     Discharge Instructions     If you worsen, go to the hospital.  Medications as prescribed.  Take care  Dr. Adriana Simas    ED Prescriptions    Medication Sig Dispense Auth. Provider   doxycycline (VIBRAMYCIN) 100 MG capsule Take 1 capsule (100 mg total) by mouth 2 (two) times daily. 20 capsule [place] Voytko G, DO   triamcinolone ointment (KENALOG) 0.5 % Apply 1 application topically 2 (two) times daily. 30 g Maurina Fawaz G, DO   mupirocin ointment (BACTROBAN) 2 % Apply 1 application topically 2 (two) times daily for 7 days. 22 g Tommie Sams, DO     PDMP not reviewed this encounter.    Everlene Other [place], [place] 02/04/20 [phone]

## 2020-02-04 NOTE — ED Triage Notes (Signed)
Patient states that he had a tick bite on Saturday in his belly button. Now his belly button is swollen and red. Patient is concerned that he now has lyme's disease. Patient states that the area is extremely itchy.

## 2020-08-10 IMAGING — CR DG CLAVICLE*L*
2 series · 2 of 2 positions shown · non-contrast
Comparison: None.

CLINICAL DATA: Shoulder pain and erythema following injury several
days ago.

EXAM:
LEFT CLAVICLE - 2+ VIEWS

[clavicle ap]
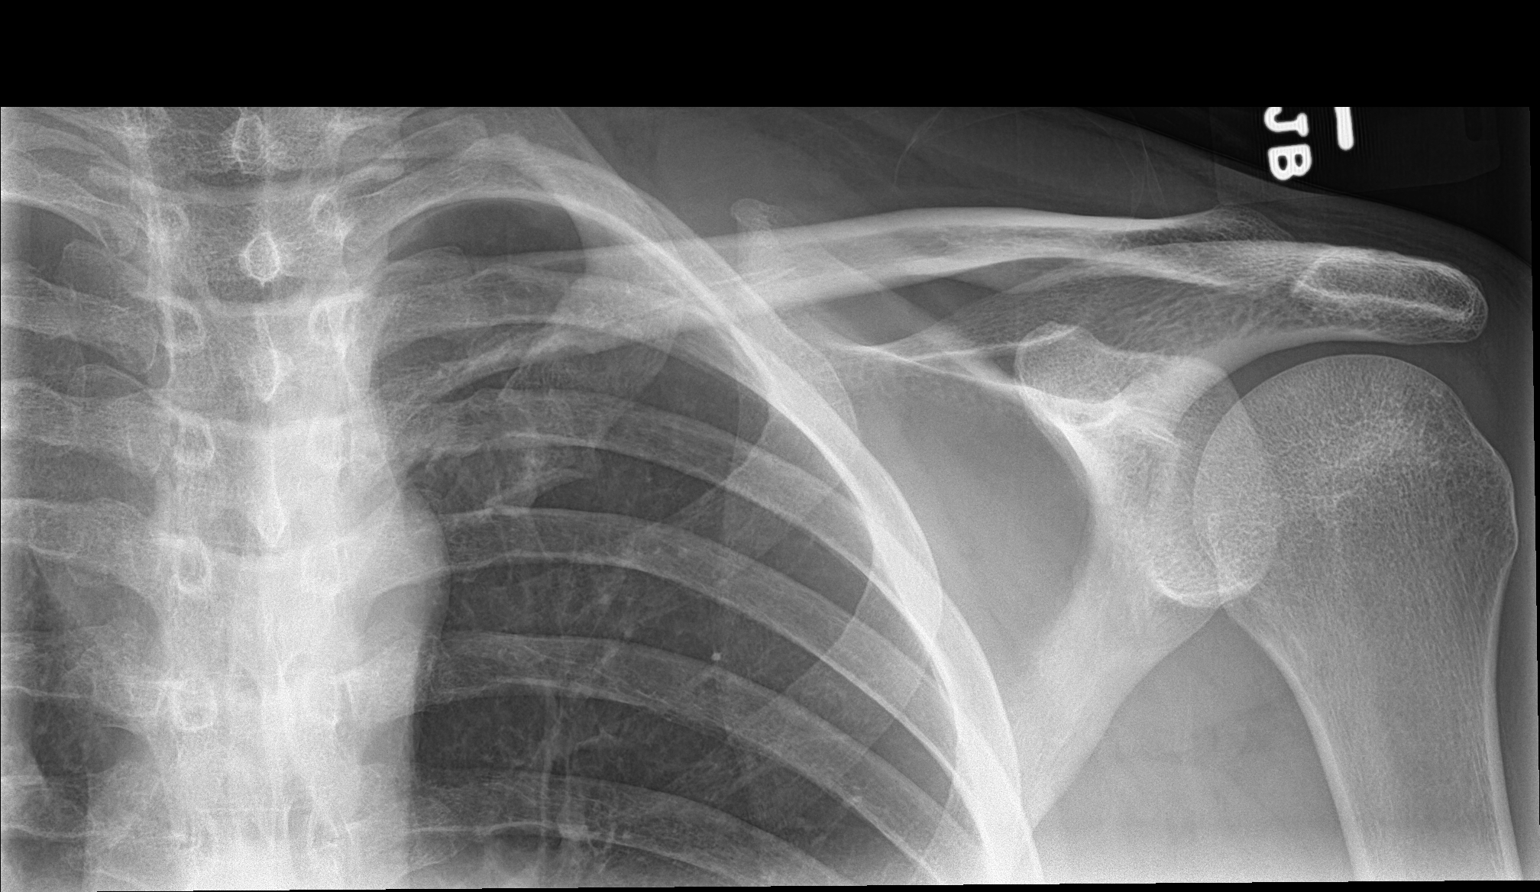

[clavicle axial]
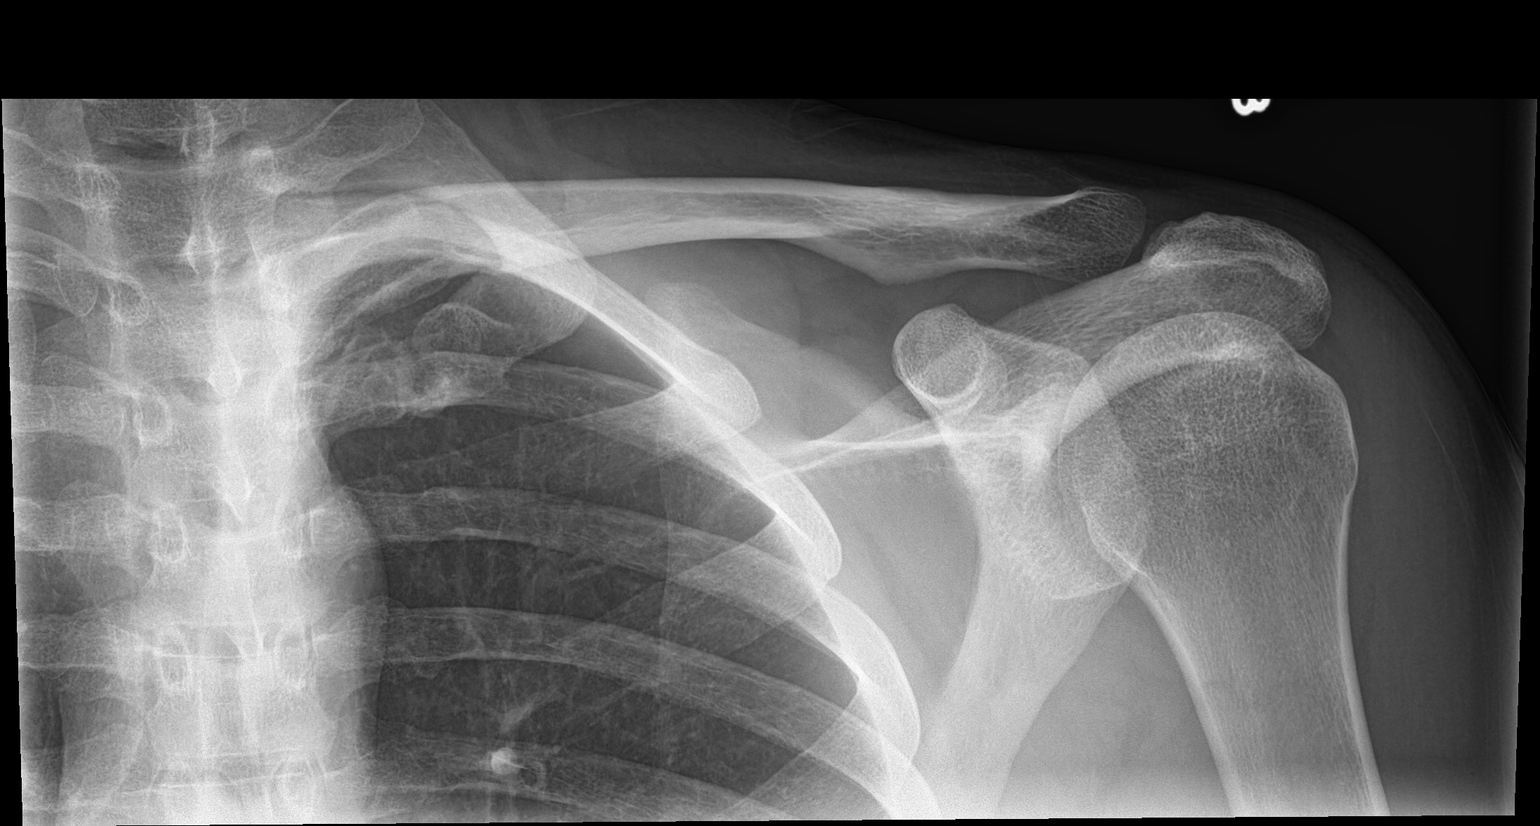

[2 of 2 positions shown; findings below may reference images not displayed]

FINDINGS: The mineralization and alignment are normal. There is no evidence of
acute fracture or dislocation. The acromioclavicular and
glenohumeral joints appear unremarkable. The subacromial space is
preserved. The sternoclavicular joint is not well visualized. No
evidence of foreign body or focal soft tissue swelling.
IMPRESSION: Normal left clavicle radiographs.  No evidence of acute injury.

## 2020-08-27 IMAGING — CR DG CHEST 1V
1 series · 1 of 1 positions shown · non-contrast
Comparison: None.

CLINICAL DATA: Overdose

EXAM:
CHEST  1 VIEW

[chest pa]
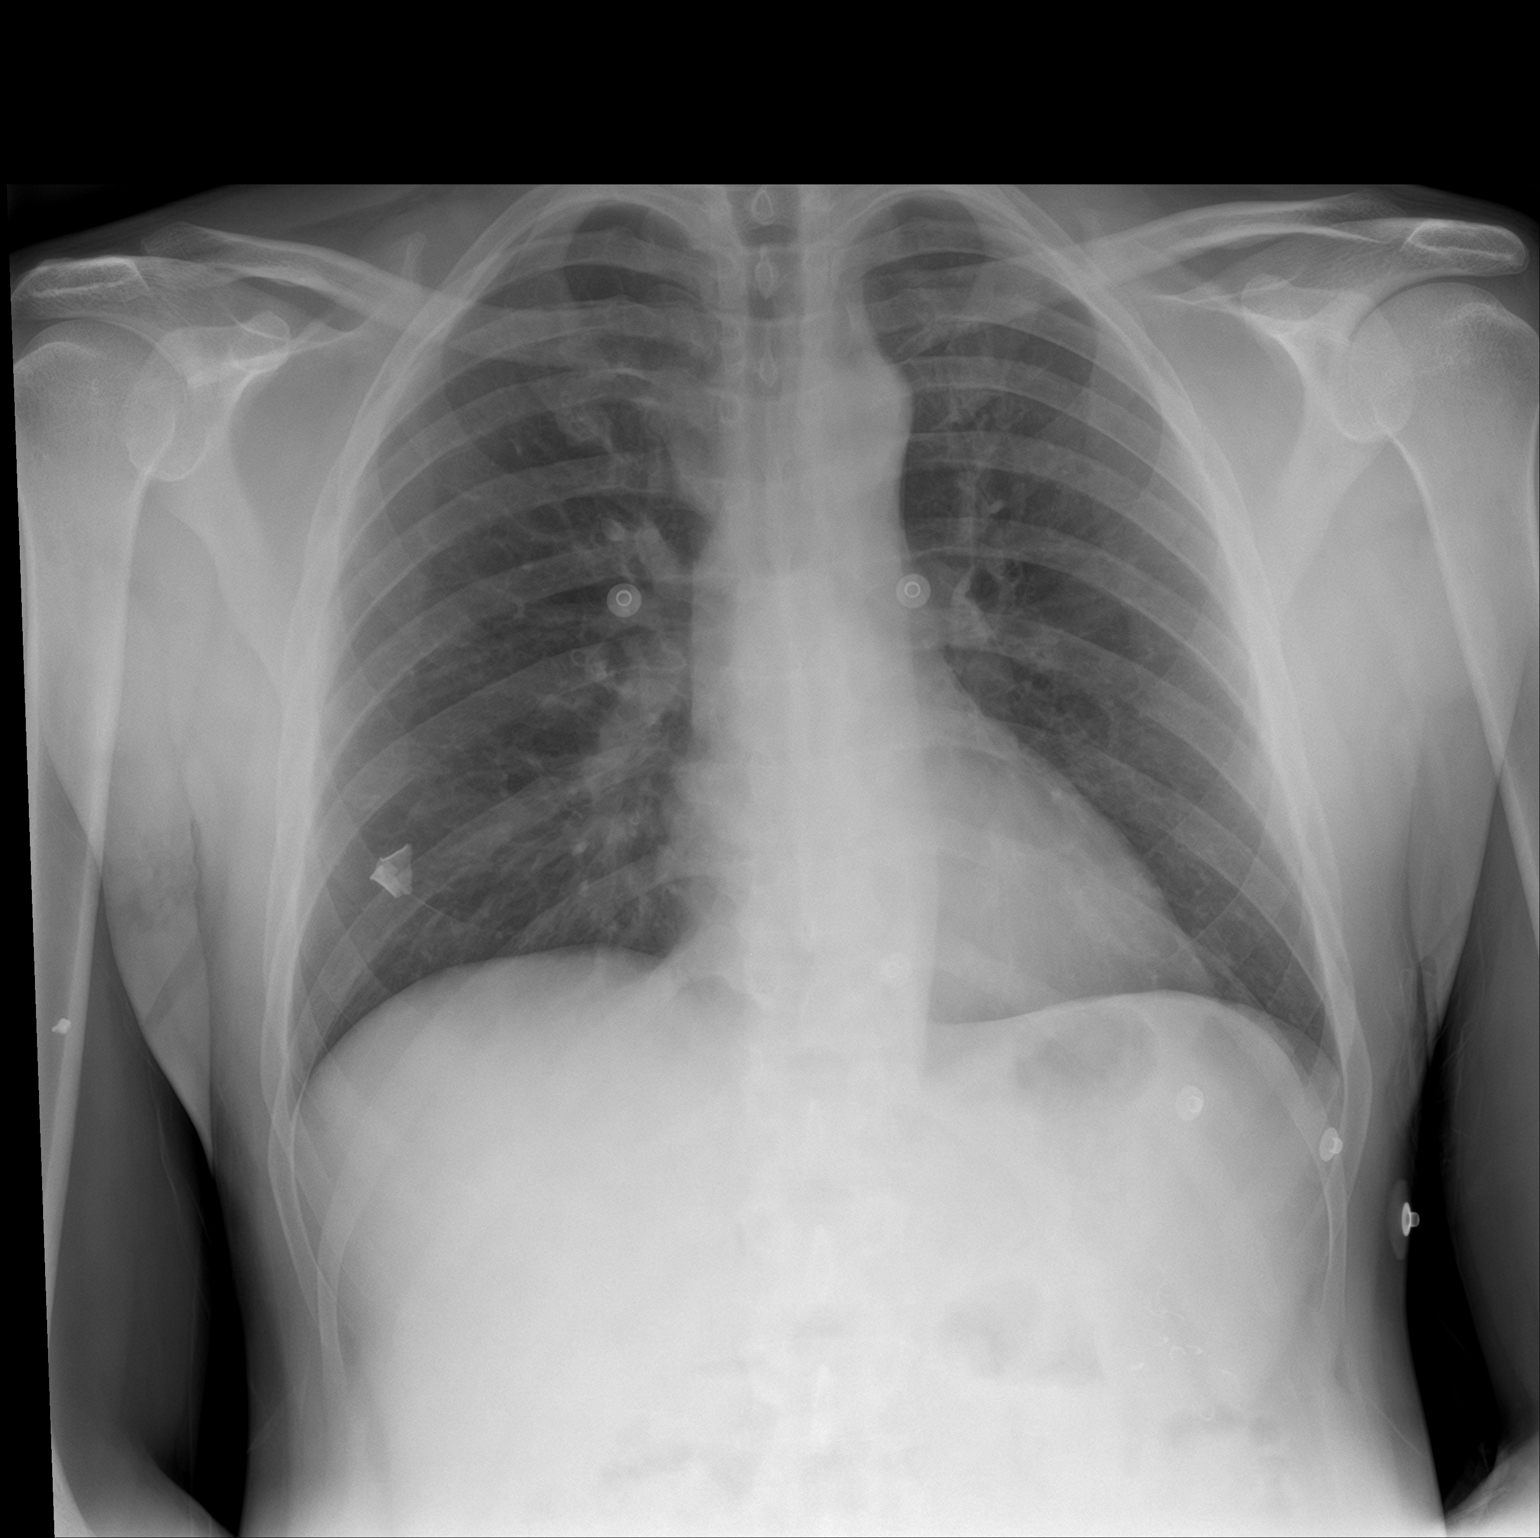

[1 of 1 positions shown; findings below may reference images not displayed]

FINDINGS: The heart size and mediastinal contours are within normal limits.
Both lungs are clear. The visualized skeletal structures are
unremarkable. Triangular opacity over the right lower chest.
Amorphous opacity over the right mid upper arm.
IMPRESSION: No active disease. Triangular opacity over the right lower chest and
small amorphous opacity over the right mid upper arm are either
artifacts or small foreign bodies.

## 2022-08-30 ENCOUNTER — Ambulatory Visit (INDEPENDENT_AMBULATORY_CARE_PROVIDER_SITE_OTHER)

## 2022-08-30 ENCOUNTER — Encounter: Payer: Self-pay | Admitting: Emergency Medicine

## 2022-08-30 ENCOUNTER — Ambulatory Visit
Admission: EM | Admit: 2022-08-30 | Discharge: 2022-08-30 | Disposition: A | Discharge status: Home / Self Care | Attending: Physician Assistant | Admitting: Physician Assistant

## 2022-08-30 DIAGNOSIS — M25572 Pain in left ankle and joints of left foot: Secondary | ICD-10-CM

## 2022-08-30 DIAGNOSIS — S93402A Sprain of unspecified ligament of left ankle, initial encounter: Secondary | ICD-10-CM | POA: Diagnosis not present

## 2022-08-30 MED ORDER — NAPROXEN 375 MG PO TABS: 375.0000 mg | ORAL_TABLET | Freq: Two times a day (BID) | ORAL | 0 refills | Status: AC

## 2022-08-30 NOTE — ED Triage Notes (Signed)
Pt states he woke up yesterday morning with left ankle pain. Pt denies any injury.

## 2022-08-30 NOTE — Discharge Instructions (Signed)
Your x-ray was normal with no evidence of a fracture which is great news.  I suspect that you have a sprain.  I recommend the RICE protocol (rest, ice, compression, elevation).  Take Naprosyn twice a day for pain.  Do not take NSAIDs with this medication including aspirin, ibuprofen/Advil, naproxen/Aleve.  If your symptoms are improving quickly please follow-up with orthopedics; call them to schedule an appointment.  If anything worsens you have increasing pain, swelling, discoloration, numbness, tingling sensation in the foot, difficulty walking you should be reevaluated immediately.
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
## 2022-12-23 ENCOUNTER — Ambulatory Visit: Admission: EM | Admit: 2022-12-23 | Discharge: 2022-12-23 | Disposition: A | Discharge status: Home / Self Care

## 2022-12-23 ENCOUNTER — Encounter: Payer: Self-pay | Admitting: Emergency Medicine

## 2022-12-23 DIAGNOSIS — M545 Low back pain, unspecified: Secondary | ICD-10-CM | POA: Diagnosis not present
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
## 2022-12-23 NOTE — Discharge Instructions (Signed)
Your pain is most likely caused by irritation to the muscles.   You may take 600 to 800 mg of ibuprofen every 6-8 hours and/or Tylenol 500 to 1000 mg every 6 hours as needed for management of pain  You may use heating pad in 15 minute intervals as needed for additional comfort, within the first 2-3 days you may find comfort in using ice in 10-15 minutes over affected area  Begin stretching affected area daily for 10 minutes as tolerated to further loosen muscles   When lying down place pillow underneath and between knees for support  Practice good posture: head back, shoulders back, chest forward, pelvis back and weight distributed evenly on both legs  If pain persist after recommended treatment or reoccurs if may be beneficial to follow up with orthopedic specialist for evaluation, this doctor specializes in the bones and can manage your symptoms long-term with options such as but not limited to imaging, medications or physical therapy
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
